# Patient Record
Sex: Male | Born: 1970 | Race: White | Hispanic: No | Marital: Married | Smoking: Never smoker
Health system: Southern US, Community
[De-identification: ages and names within clinical notes are randomized; demographics above are authoritative.]

## PROBLEM LIST (undated history)

## (undated) DIAGNOSIS — I10 Essential (primary) hypertension: Secondary | ICD-10-CM

## (undated) DIAGNOSIS — T7840XA Allergy, unspecified, initial encounter: Secondary | ICD-10-CM

## (undated) DIAGNOSIS — C801 Malignant (primary) neoplasm, unspecified: Secondary | ICD-10-CM

## (undated) HISTORY — DX: Allergy, unspecified, initial encounter: T78.40XA

## (undated) HISTORY — DX: Malignant (primary) neoplasm, unspecified: C80.1

## (undated) HISTORY — PX: TONSILLECTOMY: SUR1361

---

## 1999-10-11 HISTORY — PX: TESTICLE REMOVAL: SHX68

## 2015-03-20 ENCOUNTER — Encounter: Payer: Self-pay | Admitting: Family Medicine

## 2015-03-20 ENCOUNTER — Ambulatory Visit (INDEPENDENT_AMBULATORY_CARE_PROVIDER_SITE_OTHER): Payer: BLUE CROSS/BLUE SHIELD | Admitting: Family Medicine

## 2015-03-20 VITALS — BP 122/82 | HR 94 | Temp 98.5°F | Resp 16 | Ht 72.0 in | Wt 252.6 lb

## 2015-03-20 DIAGNOSIS — L259 Unspecified contact dermatitis, unspecified cause: Secondary | ICD-10-CM | POA: Diagnosis not present

## 2015-03-20 MED ORDER — PREDNISONE 20 MG PO TABS: ORAL_TABLET | ORAL | Status: DC

## 2015-03-20 MED ORDER — METHYLPREDNISOLONE ACETATE 80 MG/ML IJ SUSP
80.0000 mg | Freq: Once | INTRAMUSCULAR | Status: AC
  Administered 2015-03-20: 80 mg via INTRAMUSCULAR

## 2015-03-20 NOTE — Progress Notes (Signed)
Subjective:     Patient ID: Peter Ray, male   DOB: [birth year], 45 y.o.   MRN: [id number]  HPI  Chief Complaint  Patient presents with  . Rash    Patient states that two week go he was cutting grass in yard and used a weed eater in his yard, he believes that he might have gotten into poison ivy because rash formed shorltly after. Patient reports that over this two weeks he has had itching of the skin only in the lower extremity and now rash has formed into weeping blisters. Today patient noticed that rash is spreading to his left arm.   States he was wearing shorst and both walked in the brush then used the trimmer which aerosolized it. He cleansed with alcohol initially and has since been cleaning with soap and water.   Review of Systems  Constitutional: Negative for fever.       Objective:   Physical Exam  Constitutional: He appears well-developed and well-nourished. No distress.  Skin: Rash (both lower extremities with large patches of rash with vesicles and bullae) noted.       Assessment:     1. Contact dermatitis  - predniSONE (DELTASONE) 20 MG tablet; Taper as follows 3 pills x 4 days,2 pills x 4 days, one pill x 4 days  Dispense: 24 tablet; Refill: 0 - methylPREDNISolone acetate (DEPO-MEDROL) injection 80 mg; Inject 1 mL (80 mg total) into the muscle once.    Plan:     May also use oral antihistamines for relief from itching.

## 2015-03-20 NOTE — Patient Instructions (Signed)
Continue to clean with soap and water. May use antihistamines ORALLY like Benadryl and/or Claritin for itching.

## 2015-06-30 ENCOUNTER — Other Ambulatory Visit: Payer: Self-pay

## 2015-06-30 MED ORDER — CLONAZEPAM 0.5 MG PO TABS: ORAL_TABLET | ORAL | Status: DC

## 2015-06-30 NOTE — Telephone Encounter (Signed)
email from patient requesting this refilled. Please review order pulled down. LOV 12/2014-aa

## 2015-07-01 ENCOUNTER — Telehealth: Payer: Self-pay

## 2015-07-01 NOTE — Telephone Encounter (Signed)
Patient has been notified that Rx is available for pick up

## 2015-07-01 NOTE — Telephone Encounter (Signed)
-----   Message from Carmon Ginsberg, [place] sent at 06/30/2015  4:49 PM EDT ----- rx for clonazepam is available for p/u at our office.

## 2015-08-13 ENCOUNTER — Encounter: Payer: Self-pay | Admitting: Family Medicine

## 2015-08-13 ENCOUNTER — Ambulatory Visit (INDEPENDENT_AMBULATORY_CARE_PROVIDER_SITE_OTHER): Payer: BLUE CROSS/BLUE SHIELD | Admitting: Family Medicine

## 2015-08-13 ENCOUNTER — Ambulatory Visit
Admission: RE | Admit: 2015-08-13 | Discharge: 2015-08-13 | Disposition: A | Discharge status: Home / Self Care | Payer: BLUE CROSS/BLUE SHIELD | Source: Ambulatory Visit | Attending: Family Medicine | Admitting: Family Medicine

## 2015-08-13 VITALS — BP 124/82 | HR 90 | Temp 98.4°F | Resp 14 | Wt 250.0 lb

## 2015-08-13 DIAGNOSIS — R0602 Shortness of breath: Secondary | ICD-10-CM | POA: Diagnosis not present

## 2015-08-13 DIAGNOSIS — J309 Allergic rhinitis, unspecified: Secondary | ICD-10-CM | POA: Insufficient documentation

## 2015-08-13 DIAGNOSIS — F439 Reaction to severe stress, unspecified: Secondary | ICD-10-CM | POA: Insufficient documentation

## 2015-08-13 DIAGNOSIS — Z77120 Contact with and (suspected) exposure to mold (toxic): Secondary | ICD-10-CM | POA: Insufficient documentation

## 2015-08-13 DIAGNOSIS — R030 Elevated blood-pressure reading, without diagnosis of hypertension: Secondary | ICD-10-CM | POA: Insufficient documentation

## 2015-08-13 MED ORDER — PREDNISONE 5 MG (48) PO TBPK: 5.0000 mg | ORAL_TABLET | Freq: Every day | ORAL | Status: DC

## 2015-08-13 NOTE — Progress Notes (Signed)
Patient ID: Peter Ray, male   DOB: 1971/01/13, 44 y.o.   MRN: [id number]    Subjective:  HPI  Patient is here to discuss shortness of breath.  Patient states that he went to [place] about 4 weeks ago and ever since then has had difficulty breathing-describes sensation like he can not get enough air in his lungs. Not like panic attack, he does have stress in his life, some chest tightness at times, has been dizzy for about a week. He has been taking Klonopin 1/2 tablet daily and that has helped some. His B/P the order day was around 160/90. No headache. He does work around a lot of mold.  Prior to Admission medications   Medication Sig Start Date End Date Taking? Authorizing Provider  clonazePAM (KLONOPIN) 0.5 MG tablet 1/2 to one twice daily as needed for stress/anxiety 06/30/15  Yes Carmon Ginsberg, PA  loratadine (CLARITIN) 10 MG tablet Take by mouth.    Historical Provider, MD  predniSONE (DELTASONE) 20 MG tablet Taper as follows 3 pills x 4 days,2 pills x 4 days, one pill x 4 days 03/20/15   Carmon Ginsberg, PA    Patient Active Problem List   Diagnosis Date Noted  . Allergic rhinitis 08/13/2015  . Blood pressure elevated 08/13/2015  . Feeling stressed out 08/13/2015    Past Medical History  Diagnosis Date  . Cancer ([place])   . Allergy     Social History   Social History  . Marital Status: Married    Spouse Name: N/A  . Number of Children: N/A  . Years of Education: N/A   Occupational History  . Not on file.   Social History Main Topics  . Smoking status: Never Smoker   . Smokeless tobacco: Never Used  . Alcohol Use: 0.0 oz/week    0 Standard drinks or equivalent per week     Comment: occasional   . Drug Use: No  . Sexual Activity: Not on file   Other Topics Concern  . Not on file   Social History Narrative    Allergies  Allergen Reactions  . Iodinated Diagnostic Agents     contrast  . Levofloxacin   . Shellfish Allergy   . Tree Extract     and grass     Review of Systems  Constitutional: Negative.   HENT: Negative for congestion.   Respiratory: Positive for cough (at times) and shortness of breath.   Cardiovascular: Negative for chest pain, palpitations and leg swelling.  Gastrointestinal: Positive for heartburn. Negative for nausea, vomiting, abdominal pain and diarrhea.  Musculoskeletal: Negative.   Neurological: Positive for dizziness. Negative for headaches.  Psychiatric/Behavioral: The patient is nervous/anxious.      There is no immunization history on file for this patient. Objective:  BP 124/82 mmHg  Pulse 90  Temp(Src) 98.4 F (36.9 C)  Resp 14  Wt 250 lb (113.399 kg)  SpO2 97%  Physical Exam  Constitutional: He is oriented to person, place, and time and well-developed, well-nourished, and in no distress.  HENT:  Head: Normocephalic.  Eyes: Conjunctivae are normal. Pupils are equal, round, and reactive to light.  Neck: Normal range of motion. Neck supple.  Cardiovascular: Normal rate, regular rhythm, normal heart sounds and intact distal pulses.  Exam reveals no friction rub.   No murmur heard. Pulmonary/Chest: Effort normal and breath sounds normal. No respiratory distress. He has no wheezes.  Musculoskeletal: Normal range of motion. He exhibits no edema or tenderness.  Neurological: He is  alert and oriented to person, place, and time.  Psychiatric: Mood, memory, affect and judgment normal.      Assessment and Plan :  1. Shortness of breath - EKG 12-Lead - Spirometry with graph - DG Chest 2 View; Future  2. Exposure to mold - Spirometry with graph - DG Chest 2 View; Future  3. Reactive airway disease  Treatment with prednisone 5 mg 12 day taper. May need pulmonary referral or allergy referral.  4. Adjustment reaction  Patient is family about a lot of stress recently. Wife is at major health issues. Patient is considering changing jobs again later this month. Miguel Aschoff MD [facility] 08/13/2015 3:39 PM

## 2015-08-31 ENCOUNTER — Ambulatory Visit (INDEPENDENT_AMBULATORY_CARE_PROVIDER_SITE_OTHER): Payer: BLUE CROSS/BLUE SHIELD | Admitting: Family Medicine

## 2015-08-31 ENCOUNTER — Encounter: Payer: Self-pay | Admitting: Family Medicine

## 2015-08-31 VITALS — BP 118/72 | HR 94 | Temp 98.2°F | Resp 12 | Wt 252.0 lb

## 2015-08-31 DIAGNOSIS — F419 Anxiety disorder, unspecified: Secondary | ICD-10-CM

## 2015-08-31 DIAGNOSIS — R5383 Other fatigue: Secondary | ICD-10-CM | POA: Diagnosis not present

## 2015-08-31 MED ORDER — SERTRALINE HCL 50 MG PO TABS: 50.0000 mg | ORAL_TABLET | Freq: Every day | ORAL | Status: DC

## 2015-08-31 NOTE — Progress Notes (Signed)
Patient ID: Peter Ray, male   DOB: Aug 02, 1971, 44 y.o.   MRN: [id number]    Subjective:  HPI  Shortness of breath follow up: Patient was last seen on November 3rd. EKG, Spirometry, Chest Xray was done.  Diagnoses was given reactive airway disease-treated with Prednisone. Patient did not take his medication the right away he did not get instructions on how to take prednisone so he took 1 tablet daily for 6 days. Symptoms come and go and not much of a improvement at this time.  Anxiety: stress in family was an issue last time. No changes were made. He has had to take 1 tablet twice daily instead of 1 tablet. He wants to know how long this will last. He has never taking anything else for this. Pt feels better.We discussed chronic anxiety at length. Triggers for symptoms that make is get worse or acting up are like "work related stuff, death." Depression screen [facility] 12/06/2022 08/13/2015  Decreased Interest 0  Down, Depressed, Hopeless 0  PHQ - 2 Score 0  Altered sleeping 0  Tired, decreased energy 1  Change in appetite 1  Feeling bad or failure about yourself  0  Trouble concentrating 0  Moving slowly or fidgety/restless 0  Suicidal thoughts 0  PHQ-9 Score 2     Prior to Admission medications   Medication Sig Start Date End Date Taking? Authorizing Provider  clonazePAM (KLONOPIN) 0.5 MG tablet 1/2 to one twice daily as needed for stress/anxiety 06/30/15   Carmon Ginsberg, PA  loratadine (CLARITIN) 10 MG tablet Take by mouth.    Historical Provider, MD  predniSONE (STERAPRED UNI-PAK 48 TAB) 5 MG (48) TBPK tablet Take 1 tablet (5 mg total) by mouth daily. 08/13/15   Richard Maceo Pro., MD    Patient Active Problem List   Diagnosis Date Noted  . Allergic rhinitis 08/13/2015  . Blood pressure elevated 08/13/2015  . Feeling stressed out 08/13/2015    Past Medical History  Diagnosis Date  . Cancer ([place])   . Allergy     Social History   Social History  . Marital Status: Married      Spouse Name: N/A  . Number of Children: N/A  . Years of Education: N/A   Occupational History  . Not on file.   Social History Main Topics  . Smoking status: Never Smoker   . Smokeless tobacco: Never Used  . Alcohol Use: 0.0 oz/week    0 Standard drinks or equivalent per week     Comment: occasional   . Drug Use: No  . Sexual Activity: Not on file   Other Topics Concern  . Not on file   Social History Narrative    Allergies  Allergen Reactions  . Iodinated Diagnostic Agents     contrast  . Levofloxacin   . Shellfish Allergy   . Tree Extract     and grass    Review of Systems  Constitutional: Negative.   Respiratory: Positive for shortness of breath.   Cardiovascular: Negative.   Gastrointestinal: Negative.   Musculoskeletal: Negative.   Psychiatric/Behavioral: The patient is nervous/anxious.      Objective:  BP 118/72 mmHg  Pulse 94  Temp(Src) 98.2 F (36.8 C)  Resp 12  Wt 252 lb (114.306 kg)  Physical Exam  Constitutional: He is oriented to person, place, and time and well-developed, well-nourished, and in no distress.  HENT:  Head: Normocephalic and atraumatic.  Right Ear: External ear normal.  Left Ear: External ear  normal.  Nose: Nose normal.  Eyes: Conjunctivae are normal.  Neck: Neck supple.  Cardiovascular: Normal rate, regular rhythm and normal heart sounds.   Pulmonary/Chest: Effort normal and breath sounds normal.  Abdominal: Soft.  Neurological: He is alert and oriented to person, place, and time.  Skin: Skin is warm and dry.  Psychiatric: Mood, memory, affect and judgment normal.     Assessment and Plan :  1. Acute anxiety/chronic anxiety More than 50% of time spent in in counselling. - sertraline (ZOLOFT) 50 MG tablet; Take 1 tablet (50 mg total) by mouth daily.  Dispense: 30 tablet; Refill: 3  2. Other fatigue  - CBC with Differential/Platelet - Comprehensive metabolic panel - TSH   Miguel Aschoff MD [facility] 08/31/2015 3:36 PM

## 2015-09-01 ENCOUNTER — Telehealth: Payer: Self-pay

## 2015-09-01 LAB — CBC WITH DIFFERENTIAL/PLATELET
BASOS ABS: 0 10*3/uL (ref 0.0–0.2)
Basos: 0 %
EOS (ABSOLUTE): 0.3 10*3/uL (ref 0.0–0.4)
EOS: 4 %
HEMATOCRIT: 44.4 % (ref 37.5–51.0)
HEMOGLOBIN: 15.7 g/dL (ref 12.6–17.7)
IMMATURE GRANS (ABS): 0 10*3/uL (ref 0.0–0.1)
Immature Granulocytes: 0 %
LYMPHS ABS: 2.6 10*3/uL (ref 0.7–3.1)
LYMPHS: 32 %
MCH: 31 pg (ref 26.6–33.0)
MCHC: 35.4 g/dL (ref 31.5–35.7)
MCV: 88 fL (ref 79–97)
MONOCYTES: 6 %
Monocytes Absolute: 0.5 10*3/uL (ref 0.1–0.9)
NEUTROS ABS: 4.7 10*3/uL (ref 1.4–7.0)
Neutrophils: 58 %
Platelets: 227 10*3/uL (ref 150–379)
RBC: 5.06 x10E6/uL (ref 4.14–5.80)
RDW: 13.1 % (ref 12.3–15.4)
WBC: 8.1 10*3/uL (ref 3.4–10.8)

## 2015-09-01 LAB — TSH: TSH: 1.93 u[IU]/mL (ref 0.450–4.500)

## 2015-09-01 LAB — COMPREHENSIVE METABOLIC PANEL
ALBUMIN: 4.7 g/dL (ref 3.5–5.5)
ALK PHOS: 59 IU/L (ref 39–117)
ALT: 20 IU/L (ref 0–44)
AST: 25 IU/L (ref 0–40)
Albumin/Globulin Ratio: 1.9 (ref 1.1–2.5)
BILIRUBIN TOTAL: 0.3 mg/dL (ref 0.0–1.2)
BUN / CREAT RATIO: 19 (ref 9–20)
BUN: 18 mg/dL (ref 6–24)
CO2: 26 mmol/L (ref 18–29)
CREATININE: 0.94 mg/dL (ref 0.76–1.27)
Calcium: 9.4 mg/dL (ref 8.7–10.2)
Chloride: 100 mmol/L (ref 97–106)
GFR calc non Af Amer: 98 mL/min/{1.73_m2} (ref >59)
GFR, EST AFRICAN AMERICAN: 114 mL/min/{1.73_m2} (ref >59)
GLOBULIN, TOTAL: 2.5 g/dL (ref 1.5–4.5)
GLUCOSE: 82 mg/dL (ref 65–99)
Potassium: 4.6 mmol/L (ref 3.5–5.2)
SODIUM: 141 mmol/L (ref 136–144)
TOTAL PROTEIN: 7.2 g/dL (ref 6.0–8.5)

## 2015-09-01 NOTE — Telephone Encounter (Signed)
Pt informed and voiced understanding of results. 

## 2015-09-01 NOTE — Telephone Encounter (Signed)
-----   Message from Jerrol Banana., MD sent at 09/01/2015  8:13 AM EST ----- Labs normal.

## 2015-09-01 NOTE — Telephone Encounter (Signed)
LMTCB ED 

## 2015-09-04 ENCOUNTER — Telehealth: Payer: Self-pay

## 2015-09-04 NOTE — Telephone Encounter (Signed)
Patient called and states that he had to stop Sertraline. This was started last week. He developed rash and blisters on his mouth, he stopped taking the medication and has been taking Benadryl. He does not have any trouble breathing or swallowing. Patient wanted to let us know and what medication he should try now-aa

## 2015-09-07 NOTE — Telephone Encounter (Signed)
Would try totally differnet type med--Bupropion XL150mg  dail.

## 2015-09-07 NOTE — Telephone Encounter (Signed)
Left message to call back  

## 2015-09-09 MED ORDER — BUPROPION HCL ER (XL) 150 MG PO TB24: 150.0000 mg | ORAL_TABLET | Freq: Every day | ORAL | Status: DC

## 2015-09-09 NOTE — Telephone Encounter (Signed)
Pt advised adm RX sent in-aa

## 2015-09-14 ENCOUNTER — Telehealth: Payer: Self-pay | Admitting: Family Medicine

## 2015-09-14 NOTE — Telephone Encounter (Signed)
Pt called saying the last medication you gave him (he did not know the name, it was for anxiety)  Has caused a reaction,  Rash, lips a little swollen .  He said for right now he did not want to take anything for anxiety.  Pt's Call back [phone]  Thanks [name]

## 2015-09-14 NOTE — Telephone Encounter (Signed)
I d/c Bupropion and put it in his allergies.   THIS FYI:=aa

## 2015-09-30 ENCOUNTER — Ambulatory Visit (INDEPENDENT_AMBULATORY_CARE_PROVIDER_SITE_OTHER): Payer: BLUE CROSS/BLUE SHIELD | Admitting: Family Medicine

## 2015-09-30 VITALS — BP 138/82 | HR 78 | Temp 97.8°F | Resp 16 | Wt 247.0 lb

## 2015-09-30 DIAGNOSIS — Z658 Other specified problems related to psychosocial circumstances: Secondary | ICD-10-CM

## 2015-09-30 DIAGNOSIS — F439 Reaction to severe stress, unspecified: Secondary | ICD-10-CM

## 2015-09-30 NOTE — Progress Notes (Signed)
Patient ID: Peter Ray, male   DOB: Mar 08, 1971, 44 y.o.   MRN: [id number]    Subjective:  HPI Pt is here for what was suppose to be a 1 month follow up from anxiety. He was initially started on Sertraline, he called back that it gave him hives then he was switched to Bupropion, that made his lips swell. Pt wants to be off these medications and does not want to try anymore at this point. He reports that he is starting a new job and he wants to see if that will help his anxiety.   Prior to Admission medications   Medication Sig Start Date End Date Taking? Authorizing Provider  clonazePAM (KLONOPIN) 0.5 MG tablet 1/2 to one twice daily as needed for stress/anxiety 06/30/15  Yes Carmon Ginsberg, PA  loratadine (CLARITIN) 10 MG tablet Take by mouth.   Yes Historical Provider, MD    Patient Active Problem List   Diagnosis Date Noted  . Allergic rhinitis 08/13/2015  . Blood pressure elevated 08/13/2015  . Feeling stressed out 08/13/2015    Past Medical History  Diagnosis Date  . Cancer ([place])   . Allergy     Social History   Social History  . Marital Status: Married    Spouse Name: N/A  . Number of Children: N/A  . Years of Education: N/A   Occupational History  . Not on file.   Social History Main Topics  . Smoking status: Never Smoker   . Smokeless tobacco: Never Used  . Alcohol Use: 0.0 oz/week    0 Standard drinks or equivalent per week     Comment: occasional   . Drug Use: No  . Sexual Activity: Not on file   Other Topics Concern  . Not on file   Social History Narrative    Allergies  Allergen Reactions  . Iodinated Diagnostic Agents     contrast  . Levofloxacin   . Sertraline Hives  . Shellfish Allergy   . Tree Extract     and grass  . Bupropion Rash    Also lips were a little swollen    Review of Systems  Constitutional: Negative.   HENT: Negative.   Eyes: Negative.   Respiratory: Negative.   Cardiovascular: Negative.   Gastrointestinal: Negative.    Genitourinary: Negative.   Musculoskeletal: Negative.   Skin: Negative.   Neurological: Negative.   Endo/Heme/Allergies: Negative.   Psychiatric/Behavioral: The patient is nervous/anxious.      There is no immunization history on file for this patient. Objective:  BP 138/82 mmHg  Pulse 78  Temp(Src) 97.8 F (36.6 C) (Oral)  Resp 16  Wt 247 lb (112.038 kg)  Physical Exam  Constitutional: He is oriented to person, place, and time and well-developed, well-nourished, and in no distress.  Eyes: Conjunctivae and EOM are normal. Pupils are equal, round, and reactive to light.  Neck: Normal range of motion. Neck supple.  Cardiovascular: Normal rate, regular rhythm, normal heart sounds and intact distal pulses.   Pulmonary/Chest: Effort normal and breath sounds normal.  Musculoskeletal: Normal range of motion.  Neurological: He is alert and oriented to person, place, and time. He has normal reflexes. Gait normal. GCS score is 15.  Skin: Skin is warm and dry.  Psychiatric: Mood, memory, affect and judgment normal.    Lab Results  Component Value Date   WBC 8.1 08/31/2015   HCT 44.4 08/31/2015   GLUCOSE 82 08/31/2015   TSH 1.930 08/31/2015    CMP  Component Value Date/Time   NA 141 08/31/2015 1620   K 4.6 08/31/2015 1620   CL 100 08/31/2015 1620   CO2 26 08/31/2015 1620   GLUCOSE 82 08/31/2015 1620   BUN 18 08/31/2015 1620   CREATININE 0.94 08/31/2015 1620   CALCIUM 9.4 08/31/2015 1620   PROT 7.2 08/31/2015 1620   ALBUMIN 4.7 08/31/2015 1620   AST 25 08/31/2015 1620   ALT 20 08/31/2015 1620   ALKPHOS 59 08/31/2015 1620   BILITOT 0.3 08/31/2015 1620   GFRNONAA 98 08/31/2015 1620   GFRAA 114 08/31/2015 1620    Assessment and Plan :  1. Feeling stressed out/chronic anxiety Pt wants to stay off any medications for right now. He is starting a new job and thinks that will help. Will call if he needs anything and will take Klonopin if needed. Hopefully new job will  help with this issue. CPE summer 2017. Patient was seen and examined by Dr. Miguel Aschoff, and noted scribed by Webb Laws, [place] MD [facility] 09/30/2015 4:23 PM

## 2016-01-11 ENCOUNTER — Ambulatory Visit (INDEPENDENT_AMBULATORY_CARE_PROVIDER_SITE_OTHER): Payer: Managed Care, Other (non HMO) | Admitting: Family Medicine

## 2016-01-11 ENCOUNTER — Encounter: Payer: Self-pay | Admitting: Family Medicine

## 2016-01-11 VITALS — BP 118/78 | HR 88 | Temp 98.6°F | Resp 14 | Wt 239.0 lb

## 2016-01-11 DIAGNOSIS — J309 Allergic rhinitis, unspecified: Secondary | ICD-10-CM | POA: Diagnosis not present

## 2016-01-11 DIAGNOSIS — R0982 Postnasal drip: Secondary | ICD-10-CM

## 2016-01-11 NOTE — Progress Notes (Signed)
Patient ID: Peter Ray, male   DOB: 1971-01-16, 45 y.o.   MRN: [id number]       Patient: Peter Ray Male    DOB: 1971/02/05   45 y.o.   MRN: [id number] Visit Date: 01/11/2016  Today's Provider: Vernie Murders, PA   Chief Complaint  Patient presents with  . Dysphagia    post nasal drainage  . Lymphadenopathy    neck   Subjective:    HPI Pt is here today for post nasal drainage that is causing him to have dysphagia. He reports that he has not problem swallowing foods, just cold water. He reports that he feels like his lymph nodes in his neck are swollen. He thinks all this could be related to allergies but has never had them to this extent in the past. He has taken benadryl and it seems to work but makes him sleepy, so he can not take it during the day time. He has been taking Claritin also, it helps slightly. He reports that this has been going on for about 2 weeks. Denies significant dyspepsia.without reflux at night.   Patient Active Problem List   Diagnosis Date Noted  . Allergic rhinitis 08/13/2015  . Blood pressure elevated 08/13/2015  . Feeling stressed out 08/13/2015   Past Surgical History  Procedure Laterality Date  . Testicle removal Right 2001    secondary to cancer  . Tonsillectomy     History reviewed. No pertinent family history.  Allergies  Allergen Reactions  . Iodinated Diagnostic Agents     contrast  . Levofloxacin   . Sertraline Hives  . Shellfish Allergy   . Tree Extract     and grass  . Bupropion Rash    Also lips were a little swollen   Previous Medications   CLONAZEPAM (KLONOPIN) 0.5 MG TABLET    1/2 to one twice daily as needed for stress/anxiety   LORATADINE (CLARITIN) 10 MG TABLET    Take by mouth.    Review of Systems  Constitutional: Negative.   HENT: Positive for postnasal drip and sore throat.   Eyes: Negative.   Respiratory: Choking: trouble swollowing cold water.   Cardiovascular: Negative.   Gastrointestinal: Negative.     Endocrine: Negative.   Genitourinary: Negative.   Musculoskeletal: Negative.   Skin: Negative.   Allergic/Immunologic: Negative.   Neurological: Negative.   Hematological: Negative.   Psychiatric/Behavioral: Negative.     Social History  Substance Use Topics  . Smoking status: Never Smoker   . Smokeless tobacco: Never Used  . Alcohol Use: 0.0 oz/week    0 Standard drinks or equivalent per week     Comment: occasional    Objective:   BP 118/78 mmHg  Pulse 88  Temp(Src) 98.6 F (37 C) (Oral)  Resp 14  Wt 239 lb (108.41 kg)  SpO2 98%  Physical Exam  Constitutional: He is oriented to person, place, and time. He appears well-developed and well-nourished. No distress.  HENT:  Head: Normocephalic and atraumatic.  Right Ear: Hearing and external ear normal.  Left Ear: Hearing and external ear normal.  Nose: Nose normal.  Slightly irritated cobblestone-appearing posterior pharynx.  Eyes: Conjunctivae and lids are normal. Right eye exhibits no discharge. Left eye exhibits no discharge. No scleral icterus.  Neck: Neck supple.  Cardiovascular: Normal rate and regular rhythm.   Pulmonary/Chest: Effort normal and breath sounds normal. No respiratory distress.  Abdominal: Soft. Bowel sounds are normal.  Musculoskeletal: Normal range of motion.  Lymphadenopathy:  He has no cervical adenopathy.  Neurological: He is alert and oriented to person, place, and time.  Skin: Skin is intact. No lesion and no rash noted.  Psychiatric: He has a normal mood and affect. His speech is normal and behavior is normal. Thought content normal.      Assessment & Plan:     1. Postnasal drip Onset over the past couple weeks. Recommend using Allegra BID and nasal steroid. May gargle with hot saltwater and recheck prn.  2. Allergic rhinitis, unspecified allergic rhinitis type Usually has some issues with springtime pollen reaction. Will use above medications and recheck prn if further problems with  swallowing.       Vernie Murders, PA  [facility]

## 2016-06-24 ENCOUNTER — Ambulatory Visit (INDEPENDENT_AMBULATORY_CARE_PROVIDER_SITE_OTHER): Payer: Managed Care, Other (non HMO) | Admitting: Family Medicine

## 2016-06-24 ENCOUNTER — Encounter: Payer: Self-pay | Admitting: Family Medicine

## 2016-06-24 VITALS — BP 142/94 | HR 88 | Temp 98.8°F | Resp 16 | Wt 243.2 lb

## 2016-06-24 DIAGNOSIS — R109 Unspecified abdominal pain: Secondary | ICD-10-CM

## 2016-06-24 DIAGNOSIS — Z8547 Personal history of malignant neoplasm of testis: Secondary | ICD-10-CM

## 2016-06-24 LAB — POCT URINALYSIS DIPSTICK
BILIRUBIN UA: NEGATIVE
Blood, UA: NEGATIVE
Glucose, UA: NEGATIVE
KETONES UA: NEGATIVE
LEUKOCYTES UA: NEGATIVE
Nitrite, UA: NEGATIVE
PROTEIN UA: NEGATIVE
Spec Grav, UA: <=1.005
Urobilinogen, UA: 0.2
pH, UA: 6.5

## 2016-06-24 MED ORDER — DOXYCYCLINE HYCLATE 100 MG PO TABS: 100.0000 mg | ORAL_TABLET | Freq: Two times a day (BID) | ORAL | 0 refills | Status: DC

## 2016-06-24 NOTE — Progress Notes (Signed)
Patient: Peter Ray Male    DOB: [birth year]   45 y.o.   MRN: [id number] Visit Date: 06/24/2016  Today's Provider: Vernie Murders, PA   Chief Complaint  Patient presents with  . Abdominal Pain   Subjective:    Abdominal Pain  This is a new problem. The current episode started 1 to 4 weeks ago. The onset quality is sudden. The problem occurs constantly. The problem has been gradually improving. The pain is located in the LLQ and LUQ. The quality of the pain is aching. Pain radiation: left groin area into left testicle  The pain is aggravated by movement. Treatments tried: cranberry juice and advil. The treatment provided moderate relief.  Describes pain as an ache in back and left testicle. No pain with physical activities. No dysuria. Discomfort seems to be abating.  Past Medical History:  Diagnosis Date  . Allergy   . Cancer [facility])    Past Surgical History:  Procedure Laterality Date  . TESTICLE REMOVAL Right 2001   secondary to cancer  . TONSILLECTOMY     No family history on file.   Allergies  Allergen Reactions  . Iodinated Diagnostic Agents     contrast  . Levofloxacin   . Sertraline Hives  . Shellfish Allergy   . Tree Extract     and grass  . Bupropion Rash    Also lips were a little swollen      Previous Medications   CLONAZEPAM (KLONOPIN) 0.5 MG TABLET    1/2 to one twice daily as needed for stress/anxiety   FEXOFENADINE-PSEUDOEPHEDRINE (ALLEGRA-D) 60-120 MG 12 HR TABLET    Take 1 tablet by mouth 2 (two) times daily.   FLUTICASONE (FLONASE) 50 MCG/ACT NASAL SPRAY    Place into both nostrils daily.    Review of Systems  Constitutional: Negative.   Respiratory: Negative.   Cardiovascular: Negative.   Gastrointestinal: Positive for abdominal pain.    Social History  Substance Use Topics  . Smoking status: Never Smoker  . Smokeless tobacco: Never Used  . Alcohol use 0.0 oz/week     Comment: occasional    Objective:   BP (!) 142/94 (BP Location:  Right Arm, Patient Position: Sitting, Cuff Size: Large)   Pulse 88   Temp 98.8 F (37.1 C) (Oral)   Resp 16   Wt 243 lb 3.2 oz (110.3 kg)   SpO2 97%   BMI 32.98 kg/m   Physical Exam  Constitutional: He is oriented to person, place, and time. He appears well-developed and well-nourished. No distress.  HENT:  Head: Normocephalic and atraumatic.  Right Ear: Hearing normal.  Left Ear: Hearing normal.  Nose: Nose normal.  Eyes: Conjunctivae and lids are normal. Right eye exhibits no discharge. Left eye exhibits no discharge. No scleral icterus.  Neck: Neck supple.  Cardiovascular: Normal rate and regular rhythm.   Pulmonary/Chest: Effort normal and breath sounds normal. No respiratory distress.  Abdominal: Soft. Bowel sounds are normal. There is no tenderness.  Genitourinary: Penis normal.  Genitourinary Comments: Right testicle surgically absent. No masses or tenderness in left testicle. No scrotal masses or local lymphadenopathy. No abdominal pain or CVA tenderness to percussion posteriorly.  Musculoskeletal: Normal range of motion.  Neurological: He is alert and oriented to person, place, and time.  Skin: Skin is intact. No lesion and no rash noted.  Psychiatric: He has a normal mood and affect. His speech is normal and behavior is normal. Thought content normal.      Assessment &  Plan:     1. Left flank pain Onset over the past week with left flank pain radiating into the left testicle region. No hematuria, dysuria or fever. Urinalysis and microscopic exam essentially normal. May have flushed out a kidney stone with increase in fluid intake. Given antibiotic to use if testicular discomfort returns - possible epididymitis. Recheck prn. - POCT Urinalysis Dipstick - doxycycline (VIBRA-TABS) 100 MG tablet; Take 1 tablet (100 mg total) by mouth 2 (two) times daily.  Dispense: 20 tablet; Refill: 0  2. 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 No  sign of recurrence.

## 2016-07-01 ENCOUNTER — Ambulatory Visit (INDEPENDENT_AMBULATORY_CARE_PROVIDER_SITE_OTHER): Payer: Managed Care, Other (non HMO) | Admitting: Family Medicine

## 2016-07-01 ENCOUNTER — Encounter: Payer: Self-pay | Admitting: Family Medicine

## 2016-07-01 VITALS — BP 132/80 | HR 64 | Temp 98.3°F | Resp 14 | Wt 245.6 lb

## 2016-07-01 DIAGNOSIS — R109 Unspecified abdominal pain: Secondary | ICD-10-CM

## 2016-07-01 LAB — POCT URINALYSIS DIPSTICK
BILIRUBIN UA: NEGATIVE
Blood, UA: NEGATIVE
GLUCOSE UA: NEGATIVE
KETONES UA: NEGATIVE
LEUKOCYTES UA: NEGATIVE
Nitrite, UA: NEGATIVE
PH UA: 6
Protein, UA: NEGATIVE
Spec Grav, UA: 1.01
Urobilinogen, UA: 0.2

## 2016-07-01 NOTE — Progress Notes (Signed)
Patient: Peter Ray Male    DOB: 10/15/1970   45 y.o.   MRN: [id number] Visit Date: 07/01/2016  Today's Provider: Vernie Murders, PA   Chief Complaint  Patient presents with  . Flank Pain  . Follow-up   Subjective:    HPI Patient is here to follow up from office visit on 06/24/2016. Patient was prescribed Doxycycline for left flank pain. Patient reports the pain has improved some but is still there. Patient states pain is worse when lifting.   Past Medical History:  Diagnosis Date  . Allergy   . Cancer [facility])    Past Surgical History:  Procedure Laterality Date  . TESTICLE REMOVAL Right 2001   secondary to cancer  . TONSILLECTOMY     Allergies  Allergen Reactions  . Iodinated Diagnostic Agents     contrast  . Levofloxacin   . Sertraline Hives  . Shellfish Allergy   . Tree Extract     and grass  . Bupropion Rash    Also lips were a little swollen     Previous Medications   CLONAZEPAM (KLONOPIN) 0.5 MG TABLET    1/2 to one twice daily as needed for stress/anxiety   DOXYCYCLINE (VIBRA-TABS) 100 MG TABLET    Take 1 tablet (100 mg total) by mouth 2 (two) times daily.   FEXOFENADINE-PSEUDOEPHEDRINE (ALLEGRA-D) 60-120 MG 12 HR TABLET    Take 1 tablet by mouth 2 (two) times daily.   FLUTICASONE (FLONASE) 50 MCG/ACT NASAL SPRAY    Place into both nostrils daily.    Review of Systems  Constitutional: Negative.   Respiratory: Negative.   Cardiovascular: Negative.   Gastrointestinal: Positive for abdominal pain.    Social History  Substance Use Topics  . Smoking status: Never Smoker  . Smokeless tobacco: Never Used  . Alcohol use 0.0 oz/week     Comment: occasional    Objective:   BP 132/80 (BP Location: Right Arm, Patient Position: Sitting, Cuff Size: Large)   Pulse 64   Temp 98.3 F (36.8 C) (Oral)   Resp 14   Wt 245 lb 9.6 oz (111.4 kg)   BMI 33.31 kg/m   Physical Exam  Constitutional: He is oriented to person, place, and time. He appears well-developed  and well-nourished. No distress.  HENT:  Head: Normocephalic and atraumatic.  Right Ear: Hearing normal.  Left Ear: Hearing normal.  Nose: Nose normal.  Eyes: Conjunctivae and lids are normal. Right eye exhibits no discharge. Left eye exhibits no discharge. No scleral icterus.  Neck: Neck supple.  Cardiovascular: Regular rhythm.   Pulmonary/Chest: Effort normal and breath sounds normal. No respiratory distress.  Abdominal: Soft. Bowel sounds are normal.  No CVA tenderness to percussion. Questionable tender spot left upper abdomen. BS wnl. No masses, rebound or rigidity.  Genitourinary: Penis normal.  Genitourinary Comments: History of right orchidectomy. No left testicle tenderness today. No signs of hernia.    Musculoskeletal: Normal range of motion.  Neurological: He is alert and oriented to person, place, and time.  Skin: Skin is intact. No lesion and no rash noted.  Psychiatric: He has a normal mood and affect. His speech is normal and behavior is normal. Thought content normal.      Assessment & Plan:     1. Abdominal discomfort in left flank Improving discomfort. No further discomfort into the testicle. No hematuria or dysuria. No hernia palpable. Suspect possible renal stone with spontaneous passage or left epididymitis. Continue antibiotics and hot soaks. Recommend increased fluid  intake and bland diet. Recheck if no further improvement in 4 days. May need CT scan or ultrasound to rule out stone or pancreatitis. - POCT urinalysis dipstick

## 2016-07-18 ENCOUNTER — Encounter: Payer: Self-pay | Admitting: Family Medicine

## 2016-07-18 ENCOUNTER — Ambulatory Visit (INDEPENDENT_AMBULATORY_CARE_PROVIDER_SITE_OTHER): Payer: Managed Care, Other (non HMO) | Admitting: Family Medicine

## 2016-07-18 VITALS — BP 108/76 | HR 108 | Temp 98.4°F | Resp 16 | Wt 243.0 lb

## 2016-07-18 DIAGNOSIS — J302 Other seasonal allergic rhinitis: Secondary | ICD-10-CM | POA: Diagnosis not present

## 2016-07-18 DIAGNOSIS — R109 Unspecified abdominal pain: Secondary | ICD-10-CM

## 2016-07-18 DIAGNOSIS — Z8547 Personal history of malignant neoplasm of testis: Secondary | ICD-10-CM | POA: Diagnosis not present

## 2016-07-18 DIAGNOSIS — Z6832 Body mass index (BMI) 32.0-32.9, adult: Secondary | ICD-10-CM | POA: Diagnosis not present

## 2016-07-18 NOTE — Progress Notes (Signed)
Peter Ray  MRN: [id number] DOB: [birth year]  Subjective:  HPI  Patient is here to discuss left side pain-LLQ and radiates to the left lower back area. Sensation described as a achy sensation. He saw Simona Huh on 06/24/16 and UA was checked and it was normal, treated with Doxy and pain/discomfort did improve with the medication. He also has had some left testicle soreness off and on but has improved. He followed up with Simona Huh on 07/01/16 and UA was rechecked and it was normal. He has not had any imaging done for this issue. He has never had abdominal surgeries done. He has history of testicle cancer. Discomfort is worse with sitting down vs standing. Denies any urinary issues, no fevers, no vomiting. Patient is worried because of the persistence of the pain. Add to this is the fact that he has had testicular cancer and his wife has had breast cancer.Virl Axe not paranoid but they are aware that even young people can have cancer and dangerous health issues.  Patient Active Problem List   Diagnosis Date Noted  . Allergic rhinitis 08/13/2015  . Blood pressure elevated 08/13/2015  . Feeling stressed out 08/13/2015    Past Medical History:  Diagnosis Date  . Allergy   . Cancer [facility])     Social History   Social History  . Marital status: Married    Spouse name: N/A  . Number of children: N/A  . Years of education: N/A   Occupational History  . Not on file.   Social History Main Topics  . Smoking status: Never Smoker  . Smokeless tobacco: Never Used  . Alcohol use 0.0 oz/week     Comment: occasional   . Drug use: No  . Sexual activity: Not on file   Other Topics Concern  . Not on file   Social History Narrative  . No narrative on file    Outpatient Encounter Prescriptions as of 07/18/2016  Medication Sig  . fexofenadine-pseudoephedrine (ALLEGRA-D) 60-120 MG 12 hr tablet Take 1 tablet by mouth 2 (two) times daily.  . fluticasone (FLONASE) 50 MCG/ACT nasal spray Place into  both nostrils daily.  . clonazePAM (KLONOPIN) 0.5 MG tablet 1/2 to one twice daily as needed for stress/anxiety (Patient not taking: Reported on 07/18/2016)  . [DISCONTINUED] doxycycline (VIBRA-TABS) 100 MG tablet Take 1 tablet (100 mg total) by mouth 2 (two) times daily.   No facility-administered encounter medications on file as of 07/18/2016.     Allergies  Allergen Reactions  . Iodinated Diagnostic Agents     contrast  . Levofloxacin   . Sertraline Hives  . Shellfish Allergy   . Tree Extract     and grass  . Bupropion Rash    Also lips were a little swollen    Review of Systems  Constitutional: Negative.   HENT: Negative.   Eyes: Negative.   Respiratory: Negative.   Cardiovascular: Negative.   Gastrointestinal: Positive for abdominal pain.  Genitourinary:       Testicular soreness off and on  Musculoskeletal: Positive for myalgias.       Left knee clicks with straight leg.  Skin: Negative.   Neurological: Negative.   Endo/Heme/Allergies: Negative.   Psychiatric/Behavioral: Negative.    Objective:  BP 108/76   Pulse (!) 108   Temp 98.4 F (36.9 C)   Resp 16   Wt 243 lb (110.2 kg)   BMI 32.96 kg/m   Physical Exam  Constitutional: He is oriented to person, place, and time  and well-developed, well-nourished, and in no distress.  HENT:  Head: Normocephalic and atraumatic.  Cardiovascular: Normal rate, regular rhythm, normal heart sounds and intact distal pulses.   No murmur heard. Pulmonary/Chest: Effort normal and breath sounds normal. No respiratory distress. He has no wheezes.  Abdominal: Soft. He exhibits no distension and no mass. There is no tenderness. There is no guarding.  Musculoskeletal: He exhibits no tenderness.  Pulses are good and no adenopathy.  Neurological: He is alert and oriented to person, place, and time. No cranial nerve deficit. Coordination normal.  Negative figure 4.  Skin: Skin is warm and dry.  No rash.  Psychiatric: Mood, memory,  affect and judgment normal.    Assessment and Plan :  1. Abdominal discomfort in left flank Exam is normal today. Will order [place], labs-pending results. If [place] is normal will get CT scan at that time. I do not think he is having orthopedic issue, possibly muscular. Question diverticular disease.   2. History of testicular cancer Right side--s/p orchiectomy  HPI, Exam and A&P transcribed under direction and in the presence of Miguel Aschoff, MD.

## 2016-07-19 ENCOUNTER — Telehealth: Payer: Self-pay

## 2016-07-19 LAB — COMPREHENSIVE METABOLIC PANEL
A/G RATIO: 2 (ref 1.2–2.2)
ALBUMIN: 4.9 g/dL (ref 3.5–5.5)
ALT: 20 IU/L (ref 0–44)
AST: 19 IU/L (ref 0–40)
Alkaline Phosphatase: 57 IU/L (ref 39–117)
BILIRUBIN TOTAL: 0.4 mg/dL (ref 0.0–1.2)
BUN / CREAT RATIO: 17 (ref 9–20)
BUN: 19 mg/dL (ref 6–24)
CALCIUM: 9.6 mg/dL (ref 8.7–10.2)
CHLORIDE: 100 mmol/L (ref 96–106)
CO2: 25 mmol/L (ref 18–29)
Creatinine, Ser: 1.09 mg/dL (ref 0.76–1.27)
GFR, EST AFRICAN AMERICAN: 95 mL/min/{1.73_m2} (ref >59)
GFR, EST NON AFRICAN AMERICAN: 82 mL/min/{1.73_m2} (ref >59)
Globulin, Total: 2.5 g/dL (ref 1.5–4.5)
Glucose: 94 mg/dL (ref 65–99)
POTASSIUM: 4.4 mmol/L (ref 3.5–5.2)
Sodium: 141 mmol/L (ref 134–144)
TOTAL PROTEIN: 7.4 g/dL (ref 6.0–8.5)

## 2016-07-19 LAB — CBC WITH DIFFERENTIAL/PLATELET
BASOS: 0 %
Basophils Absolute: 0 10*3/uL (ref 0.0–0.2)
EOS (ABSOLUTE): 0.2 10*3/uL (ref 0.0–0.4)
EOS: 2 %
HEMATOCRIT: 43.2 % (ref 37.5–51.0)
HEMOGLOBIN: 15.4 g/dL (ref 12.6–17.7)
IMMATURE GRANS (ABS): 0 10*3/uL (ref 0.0–0.1)
IMMATURE GRANULOCYTES: 0 %
LYMPHS: 25 %
Lymphocytes Absolute: 2 10*3/uL (ref 0.7–3.1)
MCH: 31.4 pg (ref 26.6–33.0)
MCHC: 35.6 g/dL (ref 31.5–35.7)
MCV: 88 fL (ref 79–97)
MONOCYTES: 5 %
Monocytes Absolute: 0.4 10*3/uL (ref 0.1–0.9)
NEUTROS ABS: 5.3 10*3/uL (ref 1.4–7.0)
Neutrophils: 68 %
Platelets: 249 10*3/uL (ref 150–379)
RBC: 4.9 x10E6/uL (ref 4.14–5.80)
RDW: 12.9 % (ref 12.3–15.4)
WBC: 7.8 10*3/uL (ref 3.4–10.8)

## 2016-07-19 LAB — LIPASE: LIPASE: 18 U/L (ref 13–78)

## 2016-07-19 LAB — TSH: TSH: 1.87 u[IU]/mL (ref 0.450–4.500)

## 2016-07-19 LAB — AMYLASE: Amylase: 57 U/L (ref 31–124)

## 2016-07-19 NOTE — Telephone Encounter (Signed)
Pt advised.   Thanks,   -Taariq Leitz  

## 2016-07-19 NOTE — Telephone Encounter (Signed)
-----   Message from Jerrol Banana., MD sent at 07/19/2016  8:42 AM EDT ----- Labs normal.

## 2016-07-21 ENCOUNTER — Telehealth: Payer: Self-pay

## 2016-07-21 ENCOUNTER — Ambulatory Visit
Admission: RE | Admit: 2016-07-21 | Discharge: 2016-07-21 | Disposition: A | Discharge status: Home / Self Care | Payer: Managed Care, Other (non HMO) | Source: Ambulatory Visit | Attending: Family Medicine | Admitting: Family Medicine

## 2016-07-21 DIAGNOSIS — R932 Abnormal findings on diagnostic imaging of liver and biliary tract: Secondary | ICD-10-CM | POA: Diagnosis not present

## 2016-07-21 DIAGNOSIS — R109 Unspecified abdominal pain: Secondary | ICD-10-CM | POA: Insufficient documentation

## 2016-07-21 DIAGNOSIS — R1032 Left lower quadrant pain: Secondary | ICD-10-CM

## 2016-07-21 NOTE — Telephone Encounter (Signed)
Order put in and pt advised-aa

## 2016-07-21 NOTE — Telephone Encounter (Signed)
Yes--f/u with CT.

## 2016-07-21 NOTE — Telephone Encounter (Signed)
Pt advised of [place] results. Patient states what should he do. Should we do CT scan or just wait. He has discomfort/pain when he does not take Advil. He does not want to have to take this long term. What is your opinion he would like to know-aa

## 2016-07-26 ENCOUNTER — Telehealth: Payer: Self-pay | Admitting: Family Medicine

## 2016-07-26 DIAGNOSIS — R109 Unspecified abdominal pain: Secondary | ICD-10-CM

## 2016-07-26 NOTE — Telephone Encounter (Signed)
Can you please order to CT of abd/pelvis with contrast.See previous message

## 2016-07-26 NOTE — Telephone Encounter (Signed)
ok 

## 2016-07-26 NOTE — Telephone Encounter (Signed)
Please review-aa 

## 2016-07-26 NOTE — Telephone Encounter (Signed)
[name] from [facility] on behalf of Peter Ray called to see if you are willing to change CT of abd/pelvis with and without contrast to CT abd/pelvis WITH contrast only.This is approved.If you do not agree they would like a peer to peer.Phone # [phone].Case # [id number]

## 2016-07-27 ENCOUNTER — Telehealth: Payer: Self-pay | Admitting: Family Medicine

## 2016-07-27 MED ORDER — PREDNISONE 50 MG PO TABS: ORAL_TABLET | ORAL | 0 refills | Status: DC

## 2016-07-27 NOTE — Telephone Encounter (Signed)
Pt has an allergy to the CT contrast.Per [facility] pt will need a prescription of prednisone 50 MG sent to [facility] in [place].He will take 1 tablet 13 hr prior,7hr prior,1 hr prior.He also will need to take benedryl 50MG  1 hr prior.Pt has been advised of this

## 2016-07-27 NOTE — Telephone Encounter (Signed)
Please review-aa 

## 2016-07-27 NOTE — Telephone Encounter (Signed)
Prednisone sent in. Renaldo Fiddler, CMA

## 2016-07-27 NOTE — Telephone Encounter (Signed)
Order has been placed.

## 2016-07-27 NOTE — Telephone Encounter (Signed)
Ok to send this in? 

## 2016-08-04 ENCOUNTER — Ambulatory Visit
Admission: RE | Admit: 2016-08-04 | Discharge: 2016-08-04 | Disposition: A | Discharge status: Home / Self Care | Payer: Managed Care, Other (non HMO) | Source: Ambulatory Visit | Attending: Family Medicine | Admitting: Family Medicine

## 2016-08-04 DIAGNOSIS — R109 Unspecified abdominal pain: Secondary | ICD-10-CM | POA: Diagnosis not present

## 2016-08-04 DIAGNOSIS — K449 Diaphragmatic hernia without obstruction or gangrene: Secondary | ICD-10-CM | POA: Diagnosis not present

## 2016-08-04 MED ORDER — IOPAMIDOL (ISOVUE-300) INJECTION 61%
100.0000 mL | Freq: Once | INTRAVENOUS | Status: AC | PRN
  Administered 2016-08-04: 100 mL via INTRAVENOUS

## 2016-10-21 ENCOUNTER — Ambulatory Visit (INDEPENDENT_AMBULATORY_CARE_PROVIDER_SITE_OTHER): Payer: Managed Care, Other (non HMO) | Admitting: Family Medicine

## 2016-10-21 ENCOUNTER — Encounter: Payer: Self-pay | Admitting: Family Medicine

## 2016-10-21 VITALS — BP 142/92 | HR 82 | Temp 98.4°F | Resp 16 | Ht 72.0 in | Wt 247.4 lb

## 2016-10-21 DIAGNOSIS — Z8547 Personal history of malignant neoplasm of testis: Secondary | ICD-10-CM | POA: Diagnosis not present

## 2016-10-21 DIAGNOSIS — R030 Elevated blood-pressure reading, without diagnosis of hypertension: Secondary | ICD-10-CM | POA: Diagnosis not present

## 2016-10-21 DIAGNOSIS — Z0000 Encounter for general adult medical examination without abnormal findings: Secondary | ICD-10-CM

## 2016-10-21 NOTE — Progress Notes (Signed)
Patient: Peter Ray, Male    DOB: 01-10-1971, 46 y.o.   MRN: [id number] Visit Date: 10/21/2016  Today's Provider: Vernie Murders, PA   Chief Complaint  Patient presents with  . Annual Exam   Subjective:    Annual physical exam Peter Ray is a 46 y.o. male who presents today for health maintenance and complete physical. He feels fair. He reports exercising only at work. He reports he is sleeping well.  -----------------------------------------------------------------   Review of Systems  Constitutional: Positive for fatigue.  HENT: Negative.   Eyes: Negative.   Respiratory: Positive for shortness of breath.   Cardiovascular: Negative.   Gastrointestinal: Negative.   Endocrine: Negative.   Genitourinary: Negative.   Musculoskeletal: Negative.   Skin: Negative.   Allergic/Immunologic: Negative.   Neurological: Positive for light-headedness.  Hematological: Negative.   Psychiatric/Behavioral: Negative.     Social History      He  reports that he has never smoked. He has never used smokeless tobacco. He reports that he drinks alcohol. He reports that he does not use drugs.       Social History   Social History  . Marital status: Married    Spouse name: N/A  . Number of children: N/A  . Years of education: N/A   Social History Main Topics  . Smoking status: Never Smoker  . Smokeless tobacco: Never Used  . Alcohol use 0.0 oz/week     Comment: occasional   . Drug use: No  . Sexual activity: Not Asked   Other Topics Concern  . None   Social History Narrative  . None    Past Medical History:  Diagnosis Date  . Allergy   . Cancer ([place])    testicular ca. 12 yrs. radiation and surgery     Patient Active Problem List   Diagnosis Date Noted  . History of testicular cancer 07/18/2016  . Allergic rhinitis 08/13/2015  . Blood pressure elevated 08/13/2015  . Feeling stressed out 08/13/2015    Past Surgical History:  Procedure Laterality Date  .  TESTICLE REMOVAL Right 2001   secondary to cancer  . TONSILLECTOMY      Family History        Family Status  Relation Status  . Mother Alive  . Father Deceased  . Daughter Alive  . Son Alive  . Son Alive        His family history is not on file.     Allergies  Allergen Reactions  . Iodinated Diagnostic Agents     Contrast, 12 yrs ago hives, itching, difficulty breathing.   . Levofloxacin   . Sertraline Hives  . Shellfish Allergy   . Tree Extract     and grass  . Bupropion Rash    Also lips were a little swollen     Current Outpatient Prescriptions:  .  clonazePAM (KLONOPIN) 0.5 MG tablet, 1/2 to one twice daily as needed for stress/anxiety, Disp: 30 tablet, Rfl: 2 .  fexofenadine-pseudoephedrine (ALLEGRA-D) 60-120 MG 12 hr tablet, Take 1 tablet by mouth 2 (two) times daily., Disp: , Rfl:  .  fluticasone (FLONASE) 50 MCG/ACT nasal spray, Place into both nostrils daily., Disp: , Rfl:    Patient Care Team: Jerrol Banana., MD as PCP - General (Family Medicine)      Objective:   Vitals: BP (!) 142/92 (BP Location: Right Arm, Patient Position: Sitting, Cuff Size: Normal)   Pulse 82   Temp 98.4 F (  36.9 C) (Oral)   Resp 16   Ht 6' (1.829 m)   Wt 247 lb 6.4 oz (112.2 kg)   SpO2 96%   BMI 33.55 kg/m   BP Readings from Last 3 Encounters:  10/21/16 (!) 142/92  07/18/16 108/76  07/01/16 132/80    Physical Exam  Constitutional: He is oriented to person, place, and time. He appears well-developed and well-nourished.  HENT:  Head: Normocephalic and atraumatic.  Right Ear: External ear normal.  Left Ear: External ear normal.  Nose: Nose normal.  Mouth/Throat: Oropharynx is clear and moist.  Eyes: Conjunctivae and EOM are normal. Pupils are equal, round, and reactive to light. Right eye exhibits no discharge.  Neck: Normal range of motion. Neck supple. No tracheal deviation present. No thyromegaly present.  Cardiovascular: Normal rate, regular rhythm, normal  heart sounds and intact distal pulses.   No murmur heard. Pulmonary/Chest: Effort normal and breath sounds normal. No respiratory distress. He has no wheezes. He has no rales. He exhibits no tenderness.  Abdominal: Soft. He exhibits no distension and no mass. There is no tenderness. There is no rebound and no guarding.  Genitourinary:  Genitourinary Comments: Evaluation by Dr. Gilbert in October 2017.  Musculoskeletal: Normal range of motion. He exhibits no edema or tenderness.  Lymphadenopathy:    He has no cervical adenopathy.  Neurological: He is alert and oriented to person, place, and time. He has normal reflexes. No cranial nerve deficit. He exhibits normal muscle tone. Coordination normal.  Skin: Skin is warm and dry. No rash noted. No erythema.  Psychiatric: He has a normal mood and affect. His behavior is normal. Judgment and thought content normal.     Depression Screen PHQ 2/9 Scores 08/13/2015  PHQ - 2 Score 0  PHQ- 9 Score 2      Assessment & Plan:     Routine Health Maintenance and Physical Exam  Exercise Activities and Dietary recommendations Goals    Does a lot of lifting and climbing stairs at work. Recommend regular 30-40 minute exercise 3-4 times a week.      Immunization History  Administered Date(s) Administered  . DTaP 08/07/1974  . Hepatitis B 06/07/2011, 07/08/2011  . IPV 08/07/1974, 06/02/1981  . MMR 11/22/1972, 07/08/2011  . Td 03/31/1978, 06/02/1981, 08/18/1990  . Tdap 06/07/2011  . Varicella 06/10/2011, 07/08/2011    Health Maintenance  Topic Date Due  . HIV Screening  08/02/1986  . INFLUENZA VACCINE  10/21/2017 (Originally 05/10/2016)  . TETANUS/TDAP  06/06/2021     Discussed health benefits of physical activity, and encouraged him to engage in regular exercise appropriate for his age and condition.    --------------------------------------------------------------------  1. Annual physical exam Good general health. Needs to lose some  weight. Immunizations up to date. Labs on 07-18-16 were all normal. Recommend regular exercise.  2. History of testicular cancer No recurrence since removal of the right testicle in 2001. No dysuria or masses.  3. Elevated blood pressure reading Elevation today without symptoms of palpitations, chest pain or hypotension. Recommend sodium restriction, limit caffeine, limit ETOH and no tobacco use. Recheck BP in 2 months.   Dennis Chrismon, PA  [facility] 

## 2016-10-21 NOTE — Patient Instructions (Signed)
Preventing Hypertension °Hypertension, commonly called high blood pressure, is when the force of blood pumping through the arteries is too strong. Arteries are blood vessels that carry blood from the heart throughout the body. Over time, hypertension can damage the arteries and decrease blood flow to important parts of the body, including the brain, heart, and kidneys. Often, hypertension does not cause symptoms until blood pressure is very high. For this reason, it is important to have your blood pressure checked on a regular basis. °Hypertension can often be prevented with diet and lifestyle changes. If you already have hypertension, you can control it with diet and lifestyle changes, as well as medicine. °What nutrition changes can be made? °Maintain a healthy diet. This includes: °· Eating less salt (sodium). Ask your health care provider how much sodium is safe for you to have. The general recommendation is to consume less than 1 tsp (2,300 mg) of sodium a day. °? Do not add salt to your food. °? Choose low-sodium options when grocery shopping and eating out. °· Limiting fats in your diet. You can do this by eating low-fat or fat-free dairy products and by eating less red meat. °· Eating more fruits, vegetables, and whole grains. Make a goal to eat: °? 1½-2 cups of fresh fruits and vegetables each day. °? 3-4 servings of whole grains each day. °· Avoiding foods and beverages that have added sugars. °· Eating fish that contain healthy fats (omega-3 fatty acids), such as mackerel or salmon. ° °If you need help putting together a healthy eating plan, try the DASH diet. This diet is high in fruits, vegetables, and whole grains. It is low in sodium, red meat, and added sugars. DASH stands for Dietary Approaches to Stop Hypertension. °What lifestyle changes can be made? °· Lose weight if you are overweight. Losing just 3?5% of your body weight can help prevent or control hypertension. °? For example, if your present  weight is 200 lb (91 kg), a loss of 3-5% of your weight means losing 6-10 lb (2.7-4.5 kg). °? Ask your health care provider to help you with a diet and exercise plan to safely lose weight. °· Get enough exercise. Do at least 150 minutes of moderate-intensity exercise each week. °? You could do this in short exercise sessions several times a day, or you could do longer exercise sessions a few times a week. For example, you could take a brisk 10-minute walk or bike ride, 3 times a day, for 5 days a week. °· Find ways to reduce stress, such as exercising, meditating, listening to music, or taking a yoga class. If you need help reducing stress, ask your health care provider. °· Do not smoke. This includes e-cigarettes. Chemicals in tobacco and nicotine products raise your blood pressure each time you smoke. If you need help quitting, ask your health care provider. °· Avoid alcohol. If you drink alcohol, limit alcohol intake to no more than 1 drink a day for nonpregnant women and 2 drinks a day for men. One drink equals 12 oz of beer, 5 oz of wine, or 1½ oz of hard liquor. °Why are these changes important? °Diet and lifestyle changes can help you prevent hypertension, and they may make you feel better overall and improve your quality of life. If you have hypertension, making these changes will help you control it and help prevent major complications, such as: °· Hardening and narrowing of arteries that supply blood to: °? Your heart. This can cause a heart   attack. °? Your brain. This can cause a stroke. °? Your kidneys. This can cause kidney failure. °· Stress on your heart muscle, which can cause heart failure. ° °What can I do to lower my risk? °· Work with your health care provider to make a hypertension prevention plan that works for you. Follow your plan and keep all follow-up visits as told by your health care provider. °· Learn how to check your blood pressure at home. Make sure that you know your personal target  blood pressure, as told by your health care provider. °How is this treated? °In addition to diet and lifestyle changes, your health care provider may recommend medicines to help lower your blood pressure. You may need to try a few different medicines to find what works best for you. You also may need to take more than one medicine. Take over-the-counter and prescription medicines only as told by your health care provider. °Where to find support: °Your health care provider can help you prevent hypertension and help you keep your blood pressure at a healthy level. Your local hospital or your community may also provide support services and prevention programs. °The American Heart Association offers an online support network at: http://supportnetwork.heart.org/high-blood-pressure °Where to find more information: °Learn more about hypertension from: °· National Heart, Lung, and Blood Institute: www.nhlbi.nih.gov/health/health-topics/topics/hbp °· Centers for Disease Control and Prevention: www.cdc.gov/bloodpressure °· American Academy of Family Physicians: http://familydoctor.org/familydoctor/en/diseases-conditions/high-blood-pressure.printerview.all.html ° °Learn more about the DASH diet from: °· National Heart, Lung, and Blood Institute: www.nhlbi.nih.gov/health/health-topics/topics/dash ° °Contact a health care provider if: °· You think you are having a reaction to medicines you have taken. °· You have recurrent headaches or feel dizzy. °· You have swelling in your ankles. °· You have trouble with your vision. °Summary °· Hypertension often does not cause any symptoms until blood pressure is very high. It is important to get your blood pressure checked regularly. °· Diet and lifestyle changes are the most important steps in preventing hypertension. °· By keeping your blood pressure in a healthy range, you can prevent complications like heart attack, heart failure, stroke, and kidney failure. °· Work with your health  care provider to make a hypertension prevention plan that works for you. °This information is not intended to replace advice given to you by your health care provider. Make sure you discuss any questions you have with your health care provider. °Document Released: 10/11/2015 Document Revised: 06/06/2016 Document Reviewed: 06/06/2016 °Elsevier Interactive Patient Education © 2017 [employer]. ° °

## 2016-12-23 ENCOUNTER — Ambulatory Visit (INDEPENDENT_AMBULATORY_CARE_PROVIDER_SITE_OTHER): Payer: Managed Care, Other (non HMO) | Admitting: Family Medicine

## 2016-12-23 ENCOUNTER — Encounter: Payer: Self-pay | Admitting: Family Medicine

## 2016-12-23 VITALS — BP 120/74 | HR 76 | Temp 98.2°F | Resp 16 | Wt 245.0 lb

## 2016-12-23 DIAGNOSIS — G44209 Tension-type headache, unspecified, not intractable: Secondary | ICD-10-CM

## 2016-12-23 DIAGNOSIS — R030 Elevated blood-pressure reading, without diagnosis of hypertension: Secondary | ICD-10-CM

## 2016-12-23 NOTE — Patient Instructions (Signed)

## 2016-12-23 NOTE — Progress Notes (Signed)
Patient: Peter Ray Male    DOB: 07/13/1971   46 y.o.   MRN: [id number] Visit Date: 12/23/2016  Today's Provider: Vernie Murders, PA   Chief Complaint  Patient presents with  . Elevated Blood Pressure   Subjective:    HPI     Follow up for Elevated Blood Pressure without Diagnosis of Hypertension  The patient was last seen for this 2 months ago. Changes made at last visit include recommending salt restriction, limit caffeine and ETOH. No tobacco use.  He reports excellent compliance with treatment. Pt has lost 2 pounds since LOV. Pt is eating better, and limiting caffeine. He feels that condition is Unchanged. Pt is checking BP outside of the office, and usually reads 130's-140's/80's-90's. Pt reports he is very stressed out today. 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 Pt is c/o headache currently.  BP Readings from Last 3 Encounters:  12/23/16 120/74  10/21/16 (!) 142/92  07/18/16 108/76    ------------------------------------------------------------------------------------  Patient Active Problem List   Diagnosis Date Noted  . History of testicular cancer 07/18/2016  . Allergic rhinitis 08/13/2015  . Elevated blood pressure reading 08/13/2015  . Feeling stressed out 08/13/2015   Past Surgical History:  Procedure Laterality Date  . TESTICLE REMOVAL Right 2001   secondary to cancer  . TONSILLECTOMY     History reviewed. No pertinent family history.  Allergies  Allergen Reactions  . Iodinated Diagnostic Agents     Contrast, 12 yrs ago hives, itching, difficulty breathing.   . Levofloxacin   . Sertraline Hives  . Shellfish Allergy   . Tree Extract     and grass  . Bupropion Rash    Also lips were a little swollen     Current Outpatient Prescriptions:  .  fexofenadine-pseudoephedrine (ALLEGRA-D) 60-120 MG 12 hr tablet, Take 1 tablet by mouth 2 (two) times daily., Disp: , Rfl:  .  fluticasone (FLONASE) 50  MCG/ACT nasal spray, Place into both nostrils daily., Disp: , Rfl:  .  clonazePAM (KLONOPIN) 0.5 MG tablet, 1/2 to one twice daily as needed for stress/anxiety (Patient not taking: Reported on 12/23/2016), Disp: 30 tablet, Rfl: 2  Review of Systems  Constitutional: Negative for activity change, appetite change, chills, diaphoresis, fatigue, fever and unexpected weight change.  Respiratory: Negative for shortness of breath.   Cardiovascular: Negative for chest pain, palpitations and leg swelling.  Gastrointestinal: Negative for nausea and vomiting.  Musculoskeletal: Negative for neck pain.  Neurological: Positive for headaches. Negative for dizziness.    Social History  Substance Use Topics  . Smoking status: Never Smoker  . Smokeless tobacco: Never Used  . Alcohol use 0.0 oz/week     Comment: occasional    Objective:   BP 120/74 (BP Location: Right Arm, Patient Position: Sitting, Cuff Size: Large)   Pulse 76   Temp 98.2 F (36.8 C) (Oral)   Resp 16   Wt 245 lb (111.1 kg)   BMI 33.23 kg/m  Vitals:   12/23/16 1614  BP: 120/74  Pulse: 76  Resp: 16  Temp: 98.2 F (36.8 C)  TempSrc: Oral  Weight: 245 lb (111.1 kg)   Physical Exam  Constitutional: He is oriented to person, place, and time. He appears well-developed and well-nourished. No distress.  HENT:  Head: Normocephalic and atraumatic.  Right Ear: Hearing normal.  Left Ear: Hearing normal.  Nose: Nose normal.  Eyes: Conjunctivae and lids are normal. Right  eye exhibits no discharge. Left eye exhibits no discharge. No scleral icterus.  Cardiovascular: Normal rate and regular rhythm.   Pulmonary/Chest: Effort normal and breath sounds normal. No respiratory distress.  Abdominal: Soft. Bowel sounds are normal.  Musculoskeletal: Normal range of motion.  Neurological: He is alert and oriented to person, place, and time.  Skin: Skin is intact. No lesion and no rash noted.  Psychiatric: He has a normal mood and affect. His  speech is normal and behavior is normal. Thought content normal.      Assessment & Plan:     1. Elevated blood pressure reading Has limited sodium and caffeine in diet. Feeling well and BP much improved today. May have been elevated due to stresses. Recommend regular exercise and recheck prn.  2. Tension headache Some soreness in the posterior neck and pressure headache over the past week. More stressful situations at work. May try Percogesic and recheck prn.     Patient seen and examined by Vernie Murders, PA, and note scribed by Renaldo Fiddler, CMA.  Vernie Murders, PA  [facility]

## 2017-11-08 ENCOUNTER — Other Ambulatory Visit: Payer: Self-pay

## 2017-11-08 ENCOUNTER — Ambulatory Visit: Payer: Managed Care, Other (non HMO) | Admitting: Family Medicine

## 2017-11-08 VITALS — BP 130/88 | HR 76 | Temp 97.9°F | Resp 16 | Wt 247.0 lb

## 2017-11-08 DIAGNOSIS — I10 Essential (primary) hypertension: Secondary | ICD-10-CM | POA: Diagnosis not present

## 2017-11-08 MED ORDER — AMLODIPINE BESYLATE 5 MG PO TABS: 5.0000 mg | ORAL_TABLET | Freq: Every day | ORAL | 3 refills | Status: DC

## 2017-11-08 NOTE — Progress Notes (Signed)
Peter Ray  MRN: [id number] DOB: 02/19/1971  Subjective:  HPI   The patient is a 47 year old male who presents today for elevated blood pressure.  The patient states he periodically checks his blood pressure and had noticed that it was gradually increasing.  However last week he was having headaches and over the weekend had a little bit of tingling in his hands.  He checked his pressure and he had the highest reading at 162/99.  He said that it had mainly been running 150's-160's over 80's-90's. He checked it today and it was 137/88.  He also reports no headache or tingling today.   The patient is currently not on any anti-hypertensive and would really like to try and not take anything.  He said that he has already started to make changes in his habits. He admits that he has gained some more weight.  The patient also admits that last week was his wife's follow up for her breast cancer.  She has been cancer free for about 3 years but when they have the appointment he said the week is very stressful.  Patient Active Problem List   Diagnosis Date Noted  . History of testicular cancer 07/18/2016  . Allergic rhinitis 08/13/2015  . Elevated blood pressure reading 08/13/2015  . Feeling stressed out 08/13/2015    Past Medical History:  Diagnosis Date  . Allergy   . Cancer ([place])    testicular ca. 12 yrs. radiation and surgery    Social History   Socioeconomic History  . Marital status: Married    Spouse name: Not on file  . Number of children: Not on file  . Years of education: Not on file  . Highest education level: Not on file  Social Needs  . Financial resource strain: Not on file  . Food insecurity - worry: Not on file  . Food insecurity - inability: Not on file  . Transportation needs - medical: Not on file  . Transportation needs - non-medical: Not on file  Occupational History  . Not on file  Tobacco Use  . Smoking status: Never Smoker  . Smokeless tobacco: Never Used    Substance and Sexual Activity  . Alcohol use: Yes    Alcohol/week: 0.0 oz    Comment: occasional   . Drug use: No  . Sexual activity: Not on file  Other Topics Concern  . Not on file  Social History Narrative  . Not on file    Outpatient Encounter Medications as of 11/08/2017  Medication Sig  . fexofenadine-pseudoephedrine (ALLEGRA-D) 60-120 MG 12 hr tablet Take 1 tablet by mouth 2 (two) times daily.  . fluticasone (FLONASE) 50 MCG/ACT nasal spray Place into both nostrils daily.  . [DISCONTINUED] clonazePAM (KLONOPIN) 0.5 MG tablet 1/2 to one twice daily as needed for stress/anxiety (Patient not taking: Reported on 12/23/2016)   No facility-administered encounter medications on file as of 11/08/2017.     Allergies  Allergen Reactions  . Iodinated Diagnostic Agents     Contrast, 12 yrs ago hives, itching, difficulty breathing.   . Levofloxacin   . Sertraline Hives  . Shellfish Allergy   . Tree Extract     and grass  . Bupropion Rash    Also lips were a little swollen    Review of Systems  Constitutional: Negative for fever and malaise/fatigue.  HENT: Negative.   Eyes: Negative.   Respiratory: Positive for shortness of breath (Only when he knows his blood pressure is  high). Negative for cough and wheezing.   Cardiovascular: Negative for chest pain, palpitations, orthopnea, claudication and leg swelling.  Skin: Negative.   Neurological: Positive for dizziness (1 or 2 times when the pressure was the highest.), tingling and headaches. Negative for weakness.  Psychiatric/Behavioral: The patient is nervous/anxious.     Objective:  BP 130/88 (BP Location: Right Arm, Patient Position: Sitting, Cuff Size: Normal)   Pulse 76   Temp 97.9 F (36.6 C) (Oral)   Resp 16   Wt 247 lb (112 kg)   BMI 33.50 kg/m   Physical Exam  Constitutional: He is oriented to person, place, and time and well-developed, well-nourished, and in no distress.  HENT:  Head: Normocephalic and  atraumatic.  Right Ear: External ear normal.  Left Ear: External ear normal.  Nose: Nose normal.  Eyes: Conjunctivae are normal. Pupils are equal, round, and reactive to light. No scleral icterus.  Neck: Normal range of motion. No thyromegaly present.  Cardiovascular: Normal rate, regular rhythm and normal heart sounds.  Pulmonary/Chest: Effort normal and breath sounds normal.  Abdominal: Soft.  Neurological: He is alert and oriented to person, place, and time. GCS score is 15.  Skin: Skin is warm and dry.  Psychiatric: Mood, memory, affect and judgment normal.    Assessment and Plan :   1. Hypertension, unspecified type  - EKG 12-Lead  2. Essential hypertension  - amLODipine (NORVASC) 5 MG tablet; Take 1 tablet (5 mg total) by mouth daily.  Dispense: 90 tablet; Refill: 3 3.Adjustment Reaction  I have reviewed the health advisors note, was  available for consultation and I agree with documentation and plan. Miguel Aschoff MD [facility]

## 2017-12-07 ENCOUNTER — Ambulatory Visit: Payer: Self-pay | Admitting: Family Medicine

## 2017-12-11 ENCOUNTER — Ambulatory Visit: Payer: Self-pay | Admitting: Family Medicine

## 2017-12-19 ENCOUNTER — Encounter: Payer: Self-pay | Admitting: Family Medicine

## 2017-12-19 ENCOUNTER — Ambulatory Visit: Payer: Managed Care, Other (non HMO) | Admitting: Family Medicine

## 2017-12-19 VITALS — BP 122/72 | HR 78 | Temp 97.8°F | Resp 16

## 2017-12-19 DIAGNOSIS — I10 Essential (primary) hypertension: Secondary | ICD-10-CM | POA: Diagnosis not present

## 2017-12-19 DIAGNOSIS — B078 Other viral warts: Secondary | ICD-10-CM

## 2017-12-19 NOTE — Progress Notes (Signed)
Patient: Peter Ray Male    DOB: 1971/05/03   47 y.o.   MRN: [id number] Visit Date: 12/19/2017  Today's Provider: [name], MD   Chief Complaint  Patient presents with  . Hypertension   Subjective:    HPI  Hypertension, follow-up:  BP Readings from Last 3 Encounters:  12/19/17 122/72  11/08/17 130/88  12/23/16 120/74    He was last seen for hypertension 1 months ago.  BP at that visit was 130/88. Management since that visit includes started amlodipine. He reports good compliance with treatment. He is not having side effects.  He is exercising, daily. He is adherent to low salt diet.   Outside blood pressures are not checking her blood pressure. Patient denies chest pain, chest pressure/discomfort, claudication, dyspnea, exertional chest pressure/discomfort, fatigue, irregular heart beat, lower extremity edema, near-syncope, orthopnea, palpitations, paroxysmal nocturnal dyspnea, syncope and tachypnea.   Wt Readings from Last 3 Encounters:  11/08/17 247 lb (112 kg)  12/23/16 245 lb (111.1 kg)  10/21/16 247 lb 6.4 oz (112.2 kg)   ------------------------------------------------------------------------    Allergies  Allergen Reactions  . Iodinated Diagnostic Agents     Contrast, 12 yrs ago hives, itching, difficulty breathing.   . Levofloxacin   . Sertraline Hives  . Shellfish Allergy   . Tree Extract     and grass  . Bupropion Rash    Also lips were a little swollen     Current Outpatient Medications:  .  amLODipine (NORVASC) 5 MG tablet, Take 1 tablet (5 mg total) by mouth daily., Disp: 90 tablet, Rfl: 3 .  fexofenadine (ALLEGRA) 60 MG tablet, Take 60 mg by mouth 2 (two) times daily., Disp: , Rfl:  .  fluticasone (FLONASE) 50 MCG/ACT nasal spray, Place into both nostrils daily as needed. , Disp: , Rfl:   Review of Systems  Constitutional: Negative.   HENT: Negative.   Eyes: Positive for discharge and itching.  Respiratory: Negative.     Cardiovascular: Negative.   Gastrointestinal: Negative.   Endocrine: Negative.   Genitourinary: Negative.   Musculoskeletal: Positive for arthralgias.  Skin: Negative.   Allergic/Immunologic: Negative.   Neurological: Negative.   Hematological: Negative.   Psychiatric/Behavioral: Negative.     Social History   Tobacco Use  . Smoking status: Never Smoker  . Smokeless tobacco: Never Used  Substance Use Topics  . Alcohol use: Yes    Alcohol/week: 0.0 oz    Comment: occasional    Objective:   BP 122/72 (BP Location: Left Arm, Patient Position: Sitting, Cuff Size: Large)   Pulse 78   Temp 97.8 F (36.6 C) (Oral)   Resp 16  Vitals:   12/19/17 1550  BP: 122/72  Pulse: 78  Resp: 16  Temp: 97.8 F (36.6 C)  TempSrc: Oral     Physical Exam  Constitutional: He is oriented to person, place, and time. He appears well-developed and well-nourished.  HENT:  Head: Normocephalic and atraumatic.  Right Ear: External ear normal.  Left Ear: External ear normal.  Nose: Nose normal.  Mouth/Throat: Oropharynx is clear and moist.  Eyes: Conjunctivae are normal. No scleral icterus.  Neck: No thyromegaly present.  Cardiovascular: Normal rate, regular rhythm and normal heart sounds.  Pulmonary/Chest: Effort normal and breath sounds normal.  Abdominal: Soft.  Lymphadenopathy:    He has no cervical adenopathy.  Neurological: He is alert and oriented to person, place, and time.  Skin: Skin is warm and dry.  Psychiatric: He  has a normal mood and affect. His behavior is normal. Judgment and thought content normal.        Assessment & Plan:     1. Essential hypertension   2. Flat wart/facial bumps  - Ambulatory referral to Dermatology 3.Chronic Anxiety  I have done the exam and reviewed the chart and it is accurate to the best of my knowledge. Development worker, community has been used and  any errors in dictation or transcription are unintentional. Miguel Aschoff M.D. [place], MD  [facility]

## 2018-01-05 ENCOUNTER — Ambulatory Visit: Payer: Self-pay | Admitting: Family Medicine

## 2018-01-05 VITALS — BP 135/84 | HR 93 | Temp 99.3°F | Resp 18 | Ht 72.0 in | Wt 239.0 lb

## 2018-01-05 DIAGNOSIS — Z008 Encounter for other general examination: Secondary | ICD-10-CM

## 2018-01-05 DIAGNOSIS — Z0189 Encounter for other specified special examinations: Principal | ICD-10-CM

## 2018-01-05 LAB — GLUCOSE, POCT (MANUAL RESULT ENTRY): POC GLUCOSE: 108 mg/dL — AB (ref 70–99)

## 2018-01-05 NOTE — Progress Notes (Signed)
Subjective: Annual biometrics screening  Patient presents for his annual biometric screening. Patient has a history of hypertension, treated with amlodipine.  Patient also has a history of allergic rhinitis treated with Allegra as needed and Flonase.  Patient reports that he has been trying to eat a healthy, well-balanced diet and decrease the amount of stress in his life. PCP: Dr. Rosanna Randy. Patient works with groundskeeping and maintenance. Patient denies any other issues or concerns.   Review of Systems Constitutional: Unremarkable.  HEENT: Unremarkable Gastrointestinal: Unremarkable Respiratory: Unremarkable.   Cardiovascular: Unremarkable.  ROS otherwise negative.   Objective  Physical Exam General: Awake, alert and oriented. No acute distress. Well developed, hydrated and nourished. Appears stated age.  HEENT:  Supple neck without adenopathy. Sclera is non-icteric. The ear canal is clear without discharge. The tympanic membrane is normal in appearance with normal landmarks and cone of light. Nasal mucosa is pink and moist. Oral mucosa is pink and moist. The pharynx is normal in appearance without tonsillar swelling or exudates.  Skin: Skin in warm, dry and intact without rashes or lesions. Appropriate color for ethnicity. Cardiac: Heart rate and rhythm are normal. No murmurs, gallops, or rubs are auscultated.  Respiratory: The chest wall is symmetric and without deformity. No signs of respiratory distress. Lung sounds are clear in all lobes bilaterally without rales, ronchi, or wheezes.  Abdominal: Abdomen is soft, symmetric, and non-tender without distention. No masses, hepatomegaly, or splenomegaly are noted.  Neurological: The patient is awake, alert and oriented to person, place, and time with normal speech.  Memory is normal and thought processes intact. No gait abnormalities are appreciated.  Psychiatric: Appropriate mood and affect.   Assessment Annual biometrics screen  Plan   Labs pending. Encouraged routine visits with primary care provider.  Patient reports eating a donut just prior to arrival, nonfasting blood sugar shortly after that 108.

## 2018-01-05 NOTE — Progress Notes (Signed)
Pt. Stated PCP would like copy of Lipid profile results.

## 2018-01-06 LAB — LIPID PANEL
CHOL/HDL RATIO: 5 ratio (ref 0.0–5.0)
Cholesterol, Total: 206 mg/dL — ABNORMAL HIGH (ref 100–199)
HDL: 41 mg/dL (ref >39)
LDL Calculated: 115 mg/dL — ABNORMAL HIGH (ref 0–99)
Triglycerides: 248 mg/dL — ABNORMAL HIGH (ref 0–149)
VLDL CHOLESTEROL CAL: 50 mg/dL — AB (ref 5–40)

## 2018-01-08 NOTE — Progress Notes (Signed)
Dear Peter Ray, I wanted to let you know that your lipid panel came back.  Your total cholesterol is 206, normal values are between 100 and 199.  Your triglyceride level is 248, normal values are between 0 and 149.  Your VLDL cholesterol is 50, normal values are between 5 and 40. Your HDL cholesterol ("good cholesterol") is 41, normal values are above 39.  Your LDL cholesterol ("bad cholesterol") is 115, normal values are below 99.  Your cholesterol/HDL ratio is 5, normal values are between 0 and 4.4 for women or 0 and 5 from men.  These abnormal values increase your risk for cardiovascular disease.  I wanted you to be aware of these results so that you can discuss this with your primary care provider at your next regularly scheduled visit.

## 2018-04-16 ENCOUNTER — Telehealth: Payer: Self-pay | Admitting: Family Medicine

## 2018-04-16 NOTE — Telephone Encounter (Signed)
Check BP at least daily and f/u in next 1-2 weeks.

## 2018-04-16 NOTE — Telephone Encounter (Signed)
Spoke with the patient and he reports that his BP was 151/98 yesterday. He reports that the tingling that he has in his fingers comes and goes. He has the sensation in both hands and sometimes it radiates up to both arms. He denies chest pain, shortness of breath, weakness, or numbness. He has noticed some swelling in his ankles.   Patient wanted to know if this is a side effect from the medication? Will symptoms subside over time? Please advise. Thanks!

## 2018-04-16 NOTE — Telephone Encounter (Signed)
Advised patient as below. Appt scheduled in 2 weeks for recheck.

## 2018-04-16 NOTE — Telephone Encounter (Signed)
Pt called saying he is taking a beta blocker and is having a tingling in his fingers for about a month./  He wants to know if this may be a side effect from this medication  BP is usually around 51/98   CB#  [phone]  [name]

## 2018-05-03 ENCOUNTER — Encounter: Payer: Self-pay | Admitting: Family Medicine

## 2018-05-03 ENCOUNTER — Ambulatory Visit: Payer: Managed Care, Other (non HMO) | Admitting: Family Medicine

## 2018-05-03 VITALS — BP 148/82 | HR 70 | Temp 98.7°F | Resp 16 | Wt 239.0 lb

## 2018-05-03 DIAGNOSIS — I10 Essential (primary) hypertension: Secondary | ICD-10-CM

## 2018-05-03 DIAGNOSIS — F411 Generalized anxiety disorder: Secondary | ICD-10-CM

## 2018-05-03 DIAGNOSIS — Z8547 Personal history of malignant neoplasm of testis: Secondary | ICD-10-CM

## 2018-05-03 MED ORDER — LISINOPRIL 10 MG PO TABS: 10.0000 mg | ORAL_TABLET | Freq: Every day | ORAL | 3 refills | Status: DC

## 2018-05-03 NOTE — Progress Notes (Signed)
Patient: Peter Ray Male    DOB: 08/27/1971   47 y.o.   MRN: [id number] Visit Date: 05/03/2018  Today's Provider: [name], MD   Chief Complaint  Patient presents with  . Hypertension   Subjective:    HPI Patient comes in today for a follow up on hypertension. He has been on amlodipine 5mg  for about 5 months, but he reports that in the last 2 weeks, his BP has been elevated. He reports that his BP at home is averaging in the 150s/90s.   He reports that he has a tingling sensation that comes and goes in his hands and fingers. He also has occasional swelling in his ankles. He denies chest pain, shortness of breath, or dizziness.  He reports that he has had increased stress lately dealing with the passing of his uncle. He has decreased salt in his diet, decreased caffeine, and tries not to get upset.   BP Readings from Last 3 Encounters:  05/03/18 (!) 148/82  01/05/18 135/84  12/19/17 122/72       Allergies  Allergen Reactions  . Iodinated Diagnostic Agents     Contrast, 12 yrs ago hives, itching, difficulty breathing.   . Levofloxacin   . Sertraline Hives  . Shellfish Allergy   . Tree Extract     and grass  . Bupropion Rash    Also lips were a little swollen     Current Outpatient Medications:  .  amLODipine (NORVASC) 5 MG tablet, Take 1 tablet (5 mg total) by mouth daily., Disp: 90 tablet, Rfl: 3 .  fexofenadine (ALLEGRA) 60 MG tablet, Take 60 mg by mouth 2 (two) times daily., Disp: , Rfl:  .  fluticasone (FLONASE) 50 MCG/ACT nasal spray, Place into both nostrils daily as needed. , Disp: , Rfl:   Review of Systems  Constitutional: Negative for activity change, appetite change, chills, diaphoresis, fatigue, fever and unexpected weight change.  HENT: Negative for congestion and sinus pressure.   Eyes: Negative.   Respiratory: Negative for cough and shortness of breath.   Cardiovascular: Positive for leg swelling. Negative for chest pain and  palpitations.  Endocrine: Negative.   Musculoskeletal: Negative for arthralgias.  Skin: Negative for color change, pallor, rash and wound.  Allergic/Immunologic: Negative for environmental allergies.  Neurological: Positive for light-headedness and numbness. Negative for dizziness, facial asymmetry and headaches.  Psychiatric/Behavioral: Positive for agitation, decreased concentration and sleep disturbance. Negative for self-injury and suicidal ideas. The patient is nervous/anxious.     Social History   Tobacco Use  . Smoking status: Never Smoker  . Smokeless tobacco: Never Used  Substance Use Topics  . Alcohol use: Yes    Alcohol/week: 0.0 oz    Comment: occasional    Objective:   BP (!) 148/82 (BP Location: Right Arm, Patient Position: Sitting, Cuff Size: Large)   Pulse 70   Temp 98.7 F (37.1 C)   Resp 16   Wt 239 lb (108.4 kg)   SpO2 98%   BMI 32.41 kg/m  Vitals:   05/03/18 1559  BP: (!) 148/82  Pulse: 70  Resp: 16  Temp: 98.7 F (37.1 C)  SpO2: 98%  Weight: 239 lb (108.4 kg)     Physical Exam  Constitutional: He is oriented to person, place, and time. He appears well-developed and well-nourished.  HENT:  Head: Normocephalic and atraumatic.  Right Ear: External ear normal.  Left Ear: External ear normal.  Nose: Nose normal.  Eyes: Conjunctivae  are normal. No scleral icterus.  Neck: No thyromegaly present.  Cardiovascular: Normal rate, regular rhythm and normal heart sounds.  Pulmonary/Chest: Effort normal and breath sounds normal.  Abdominal: Soft.  Musculoskeletal: He exhibits no edema.  Neurological: He is alert and oriented to person, place, and time.  Skin: Skin is warm and dry.  Psychiatric: He has a normal mood and affect. His behavior is normal. Judgment and thought content normal.        Assessment & Plan:     1. Essential hypertension  - lisinopril (PRINIVIL,ZESTRIL) 10 MG tablet; Take 1 tablet (10 mg total) by mouth daily.  Dispense: 30  tablet; Refill: 3 2.Chronic Anxiety     I have done the exam and reviewed the above chart and it is accurate to the best of my knowledge. Development worker, community has been used in this note in any air is in the dictation or transcription are unintentional.  [name], MD  [place]

## 2018-06-07 ENCOUNTER — Encounter: Payer: Self-pay | Admitting: Family Medicine

## 2018-06-07 ENCOUNTER — Ambulatory Visit: Payer: Managed Care, Other (non HMO) | Admitting: Family Medicine

## 2018-06-07 VITALS — BP 114/68 | HR 88 | Temp 98.7°F | Wt 241.0 lb

## 2018-06-07 DIAGNOSIS — R030 Elevated blood-pressure reading, without diagnosis of hypertension: Secondary | ICD-10-CM | POA: Diagnosis not present

## 2018-06-07 DIAGNOSIS — I10 Essential (primary) hypertension: Secondary | ICD-10-CM | POA: Diagnosis not present

## 2018-06-07 IMAGING — CT CT ABD-PELV W/ CM
2 of 5 series · 15 of 46 positions shown, 17 images · IV contrast (APPLIED)
Comparison: Abdominal ultrasound 07/21/2016

CLINICAL DATA: Left side abdominal pain, left flank pain starting
[DATE], history of testicular cancer 12 years ago treated with
surgery and radiation therapy

EXAM:
CT ABDOMEN AND PELVIS WITH CONTRAST
TECHNIQUE: Multidetector CT imaging of the abdomen and pelvis was performed
using the standard protocol following bolus administration of
intravenous contrast.
CONTRAST:  100mL SRX0RG-JSS IOPAMIDOL (SRX0RG-JSS) INJECTION 61%

[Series 2: axial st · axial · 0.75mm/px · z∈[-564,-104]mm · 12 of 104 slices shown, 14 images]
[im 6/104  soft-tissue]
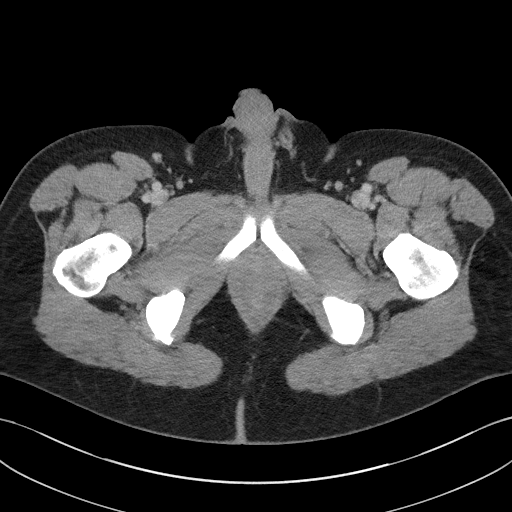
[im 6/104  bone]
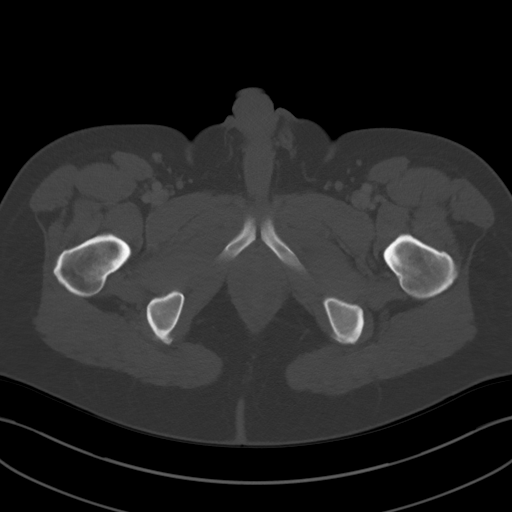
[im 17/104  soft-tissue]
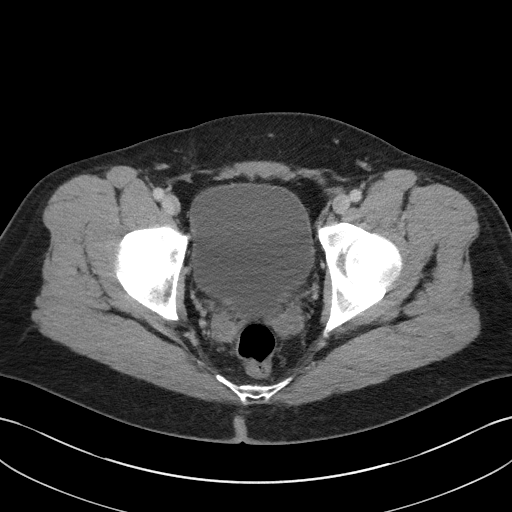
[im 22/104  soft-tissue]
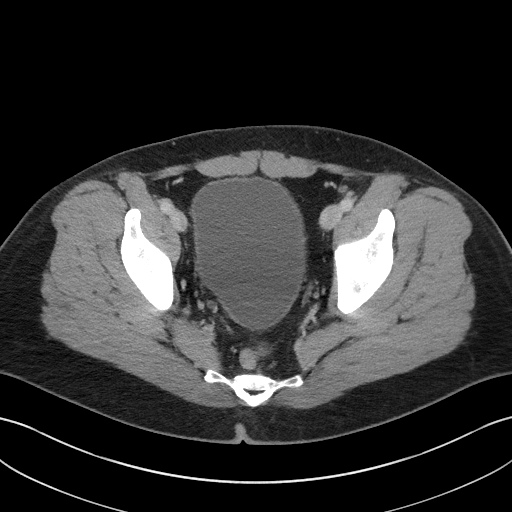
[im 33/104  soft-tissue]
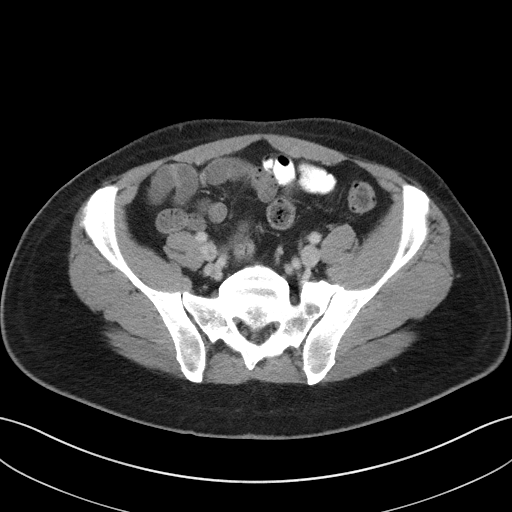
[im 38/104  soft-tissue]
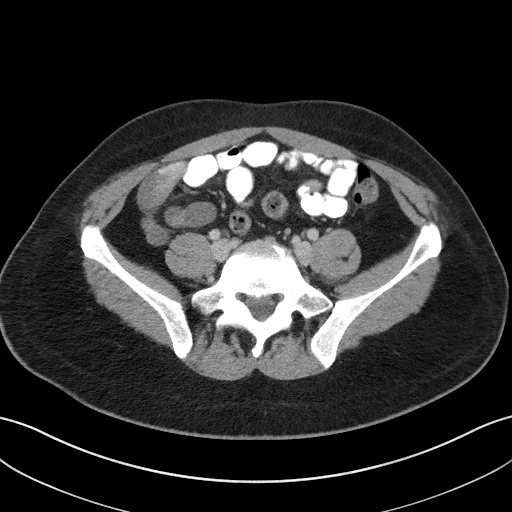
[im 49/104  soft-tissue]
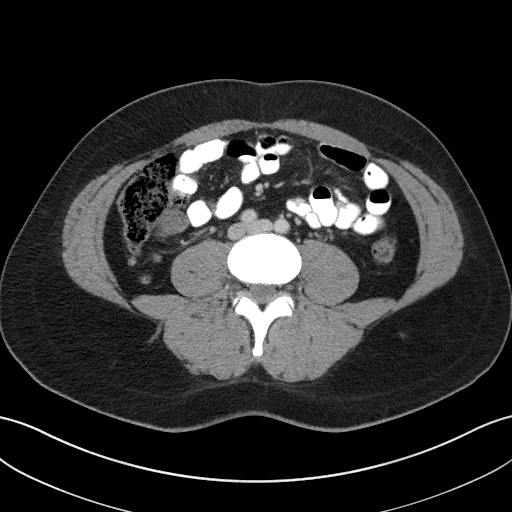
[im 55/104  soft-tissue]
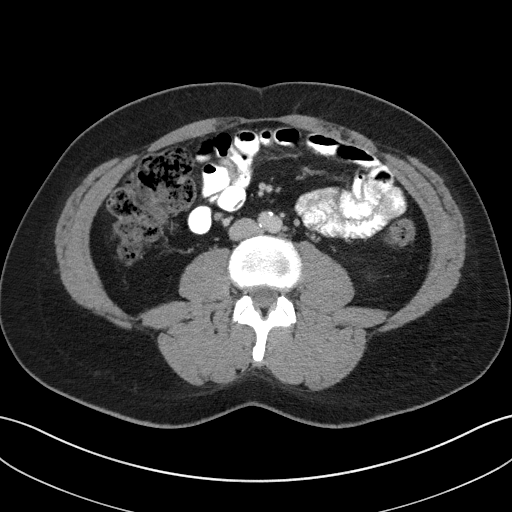
[im 66/104  soft-tissue]
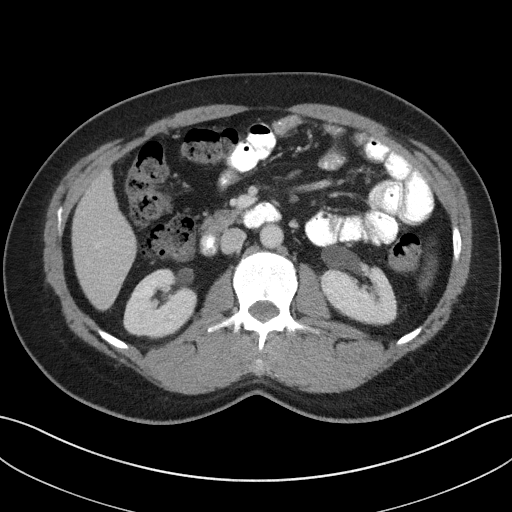
[im 71/104  soft-tissue]
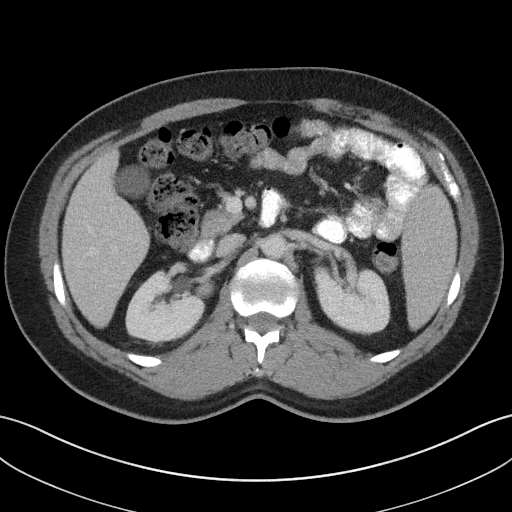
[im 71/104  bone]
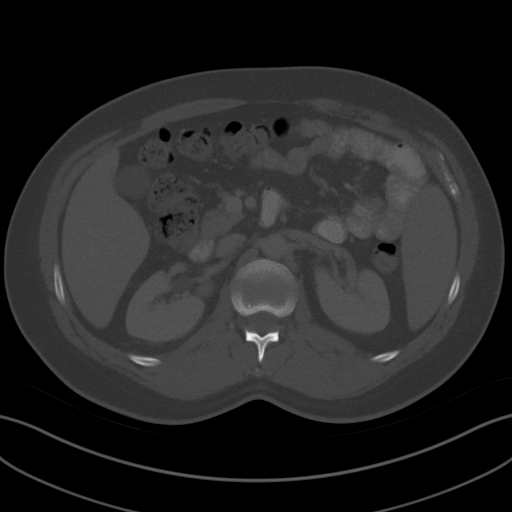
[im 82/104  soft-tissue]
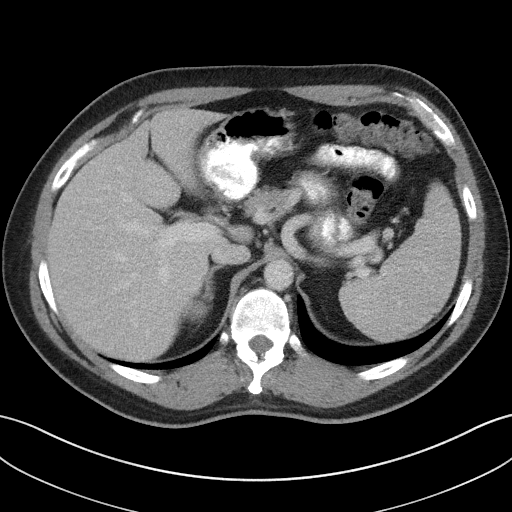
[im 87/104  soft-tissue]
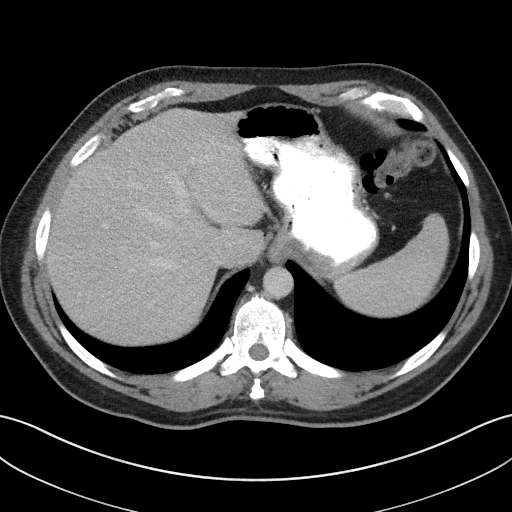
[im 98/104  soft-tissue]
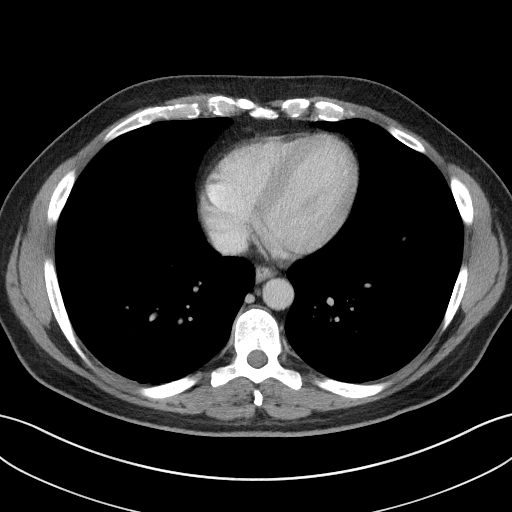

[Series 5: coronal st · coronal · 0.75mm/px · 3 of 92 slices shown]
[im 31/92  soft-tissue]
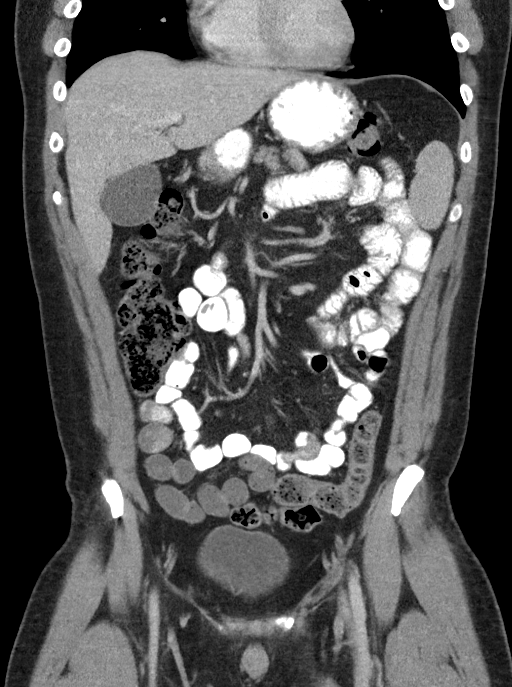
[im 41/92  soft-tissue]
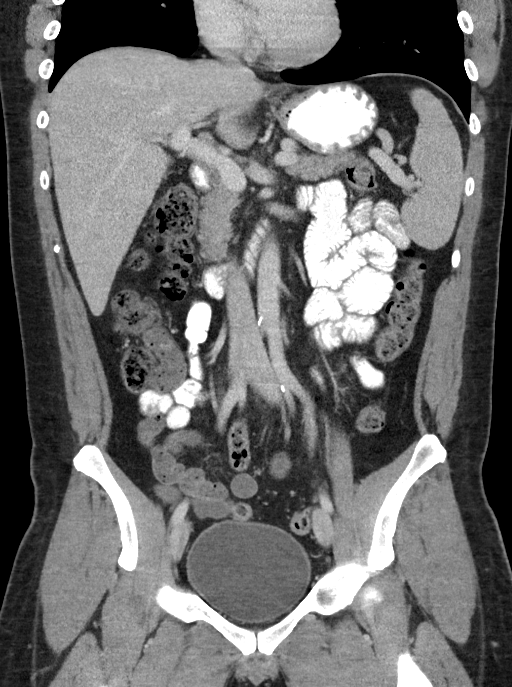
[im 51/92  soft-tissue]
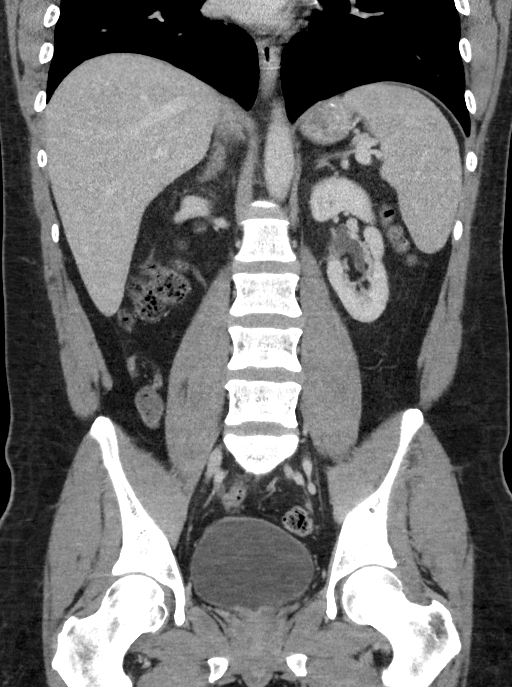

[15 of 46 positions shown; findings below may reference images not displayed]

FINDINGS: Lower chest: The lung bases are unremarkable.  Small hiatal hernia.

Hepatobiliary: Enhanced liver is unremarkable. No focal hepatic
mass. No calcified gallstones are noted within gallbladder.

Pancreas: Enhanced pancreas is unremarkable.

Spleen: The spleen measures 12.8 cm in length.  No splenic mass.

Adrenals/Urinary Tract: Kidneys are symmetrical in size and
enhancement. No adrenal gland mass. No hydronephrosis or
hydroureter.

Delayed renal images shows bilateral renal symmetrical excretion.
Bilateral visualized proximal ureter is unremarkable. Moderate
distended urinary bladder.

Stomach/Bowel: Oral contrast material was given to the patient. No
gastric outlet obstruction. No small bowel obstruction. The terminal
ileum is unremarkable. Moderate stool noted within right colon and
cecum. Normal appendix noted in axial image 57. No pericecal
inflammation. There is colonic stool for scratch there is some
colonic stool within transverse colon and descending colon. Mild
redundant sigmoid colon. No distal colonic obstruction. No colitis
or diverticulitis.

Vascular/Lymphatic: No retroperitoneal or mesenteric adenopathy.
Mild atherosclerotic calcifications of right common iliac artery.

Reproductive: Prostate gland and seminal vesicles are unremarkable.
The no inguinal adenopathy.

Other: No ascites or free abdominal air.

Musculoskeletal: Sagittal images of the spine shows no destructive
bony lesions. Mild degenerative changes thoracic spine.
IMPRESSION: 1. No evidence of acute inflammatory process within abdomen.
2. Small hiatal hernia.
3. No hydronephrosis or hydroureter.
4. Moderate stool noted within cecum and right colon. No pericecal
inflammation. Normal appendix.
5. No small bowel obstruction.
6. No ascites or free air. No retroperitoneal or mesenteric
adenopathy.
7. Moderate distended urinary bladder.
8. No inguinal adenopathy.

## 2018-06-07 NOTE — Progress Notes (Signed)
Patient: Peter Ray Male    DOB: 04/13/1971   47 y.o.   MRN: [id number] Visit Date: 06/07/2018  Today's Provider: [name], MD   Chief Complaint  Patient presents with  . Hypertension    One month follow up   Subjective:    Hypertension  This is a chronic (Pt comes in for a one month follow up.  He started lisinopril 10mg  a day at his last visit. ) problem. The problem is controlled. Associated symptoms include anxiety. Pertinent negatives include no blurred vision, chest pain, headaches, malaise/fatigue, neck pain, orthopnea, palpitations, peripheral edema, PND, shortness of breath or sweats. There are no associated agents to hypertension. The current treatment provides moderate improvement. There are no compliance problems.    BP Readings from Last 3 Encounters:  06/07/18 114/68  05/03/18 (!) 148/82  01/05/18 135/84    Allergies  Allergen Reactions  . Iodinated Diagnostic Agents     Contrast, 12 yrs ago hives, itching, difficulty breathing.   . Levofloxacin   . Sertraline Hives  . Shellfish Allergy   . Tree Extract     and grass  . Bupropion Rash    Also lips were a little swollen     Current Outpatient Medications:  .  amLODipine (NORVASC) 5 MG tablet, Take 1 tablet (5 mg total) by mouth daily., Disp: 90 tablet, Rfl: 3 .  fexofenadine (ALLEGRA) 60 MG tablet, Take 60 mg by mouth 2 (two) times daily., Disp: , Rfl:  .  fluticasone (FLONASE) 50 MCG/ACT nasal spray, Place into both nostrils daily as needed. , Disp: , Rfl:  .  lisinopril (PRINIVIL,ZESTRIL) 10 MG tablet, Take 1 tablet (10 mg total) by mouth daily., Disp: 30 tablet, Rfl: 3  Review of Systems  Constitutional: Negative.  Negative for malaise/fatigue.  Eyes: Negative for blurred vision.  Respiratory: Negative.  Negative for shortness of breath.   Cardiovascular: Negative.  Negative for chest pain, palpitations, orthopnea and PND.  Gastrointestinal: Negative.   Endocrine: Negative.     Musculoskeletal: Negative for neck pain.  Allergic/Immunologic: Negative.   Neurological: Negative for dizziness, light-headedness and headaches.  Hematological: Negative.   Psychiatric/Behavioral: Negative.     Social History   Tobacco Use  . Smoking status: Never Smoker  . Smokeless tobacco: Never Used  Substance Use Topics  . Alcohol use: Yes    Alcohol/week: 0.0 standard drinks    Comment: occasional    Objective:   BP 114/68 (BP Location: Right Arm, Patient Position: Sitting, Cuff Size: Large)   Pulse 88   Temp 98.7 F (37.1 C) (Oral)   Wt 241 lb (109.3 kg)   SpO2 95%   BMI 32.69 kg/m  Vitals:   06/07/18 1538  BP: 114/68  Pulse: 88  Temp: 98.7 F (37.1 C)  TempSrc: Oral  SpO2: 95%  Weight: 241 lb (109.3 kg)     Physical Exam  Constitutional: He is oriented to person, place, and time. He appears well-developed and well-nourished.  HENT:  Head: Normocephalic and atraumatic.  Right Ear: External ear normal.  Left Ear: External ear normal.  Nose: Nose normal.  Mouth/Throat: Oropharynx is clear and moist.  Eyes: Conjunctivae are normal. No scleral icterus.  Neck: No thyromegaly present.  Cardiovascular: Normal rate, regular rhythm and normal heart sounds.  Pulmonary/Chest: Effort normal and breath sounds normal.  Abdominal: Soft.  Musculoskeletal: He exhibits no edema.  Neurological: He is alert and oriented to person, place, and time.  Skin:  Skin is warm and dry.  Psychiatric: He has a normal mood and affect. His behavior is normal. Judgment and thought content normal.        Assessment & Plan:     1. Elevated blood pressure reading  - Comprehensive metabolic panel  2. Essential hypertension Better on lisinopril. - Comprehensive metabolic panel 3.Chronic Anxiety RTC end of year.      I have done the exam and reviewed the above chart and it is accurate to the best of my knowledge. Development worker, community has been used in this note in any air is in  the dictation or transcription are unintentional.  [name], MD  [place]

## 2018-06-08 LAB — COMPREHENSIVE METABOLIC PANEL
A/G RATIO: 1.8 (ref 1.2–2.2)
ALBUMIN: 4.6 g/dL (ref 3.5–5.5)
ALT: 19 IU/L (ref 0–44)
AST: 18 IU/L (ref 0–40)
Alkaline Phosphatase: 63 IU/L (ref 39–117)
BUN/Creatinine Ratio: 16 (ref 9–20)
BUN: 18 mg/dL (ref 6–24)
Bilirubin Total: 0.3 mg/dL (ref 0.0–1.2)
CALCIUM: 9.3 mg/dL (ref 8.7–10.2)
CO2: 25 mmol/L (ref 20–29)
CREATININE: 1.12 mg/dL (ref 0.76–1.27)
Chloride: 101 mmol/L (ref 96–106)
GFR, EST AFRICAN AMERICAN: 91 mL/min/{1.73_m2} (ref >59)
GFR, EST NON AFRICAN AMERICAN: 78 mL/min/{1.73_m2} (ref >59)
GLOBULIN, TOTAL: 2.6 g/dL (ref 1.5–4.5)
Glucose: 87 mg/dL (ref 65–99)
POTASSIUM: 4.6 mmol/L (ref 3.5–5.2)
SODIUM: 142 mmol/L (ref 134–144)
TOTAL PROTEIN: 7.2 g/dL (ref 6.0–8.5)

## 2018-06-12 ENCOUNTER — Telehealth: Payer: Self-pay

## 2018-06-12 NOTE — Telephone Encounter (Signed)
Pt advised.   Thanks,   -Willey Due  

## 2018-06-12 NOTE — Telephone Encounter (Signed)
-----   Message from Jerrol Banana., MD sent at 06/12/2018 10:15 AM EDT ----- Labs ok.

## 2018-09-04 ENCOUNTER — Other Ambulatory Visit: Payer: Self-pay | Admitting: Family Medicine

## 2018-09-04 DIAGNOSIS — I10 Essential (primary) hypertension: Secondary | ICD-10-CM

## 2018-09-24 ENCOUNTER — Ambulatory Visit: Payer: Managed Care, Other (non HMO) | Admitting: Family Medicine

## 2018-09-24 VITALS — BP 110/72 | HR 85 | Temp 98.3°F | Resp 16 | Wt 242.0 lb

## 2018-09-24 DIAGNOSIS — Z8547 Personal history of malignant neoplasm of testis: Secondary | ICD-10-CM

## 2018-09-24 DIAGNOSIS — I10 Essential (primary) hypertension: Secondary | ICD-10-CM | POA: Diagnosis not present

## 2018-09-24 MED ORDER — AMLODIPINE BESYLATE 5 MG PO TABS: 5.0000 mg | ORAL_TABLET | Freq: Every day | ORAL | 12 refills | Status: DC

## 2018-09-24 NOTE — Progress Notes (Signed)
Peter Ray  MRN: [id number] DOB: 08-23-1971  Subjective:  HPI   The patient is a 47 year old male who presents for follow up of his hypertension.  He was last seen on 06/07/18.  On that visit his pressure was improved on Lisinopril.  The patient does complain that his joints have been hurting since going on the Lisinopril.  He also needs to have his Amlodipine refilled. Patient is married, his wife is doing well, has 3 children are doing fine.  BP Readings from Last 3 Encounters:  09/24/18 110/72  06/07/18 114/68  05/03/18 (!) 148/82     Patient Active Problem List   Diagnosis Date Noted  . History of testicular cancer 07/18/2016  . Allergic rhinitis 08/13/2015  . Elevated blood pressure reading 08/13/2015  . Feeling stressed out 08/13/2015    Past Medical History:  Diagnosis Date  . Allergy   . Cancer ([place])    testicular ca. 12 yrs. radiation and surgery    Social History   Socioeconomic History  . Marital status: Married    Spouse name: Not on file  . Number of children: Not on file  . Years of education: Not on file  . Highest education level: Not on file  Occupational History  . Not on file  Social Needs  . Financial resource strain: Not on file  . Food insecurity:    Worry: Not on file    Inability: Not on file  . Transportation needs:    Medical: Not on file    Non-medical: Not on file  Tobacco Use  . Smoking status: Never Smoker  . Smokeless tobacco: Never Used  Substance and Sexual Activity  . Alcohol use: Yes    Alcohol/week: 0.0 standard drinks    Comment: occasional   . Drug use: No  . Sexual activity: Not on file  Lifestyle  . Physical activity:    Days per week: Not on file    Minutes per session: Not on file  . Stress: Not on file  Relationships  . Social connections:    Talks on phone: Not on file    Gets together: Not on file    Attends religious service: Not on file    Active member of club or organization: Not on file   Attends meetings of clubs or organizations: Not on file    Relationship status: Not on file  . Intimate partner violence:    Fear of current or ex partner: Not on file    Emotionally abused: Not on file    Physically abused: Not on file    Forced sexual activity: Not on file  Other Topics Concern  . Not on file  Social History Narrative  . Not on file    Outpatient Encounter Medications as of 09/24/2018  Medication Sig  . amLODipine (NORVASC) 5 MG tablet Take 1 tablet (5 mg total) by mouth daily.  . fexofenadine (ALLEGRA) 60 MG tablet Take 60 mg by mouth 2 (two) times daily.  . fluticasone (FLONASE) 50 MCG/ACT nasal spray Place into both nostrils daily as needed.   Marland Kitchen lisinopril (PRINIVIL,ZESTRIL) 10 MG tablet TAKE 1 TABLET(10 MG) BY MOUTH DAILY   No facility-administered encounter medications on file as of 09/24/2018.     Allergies  Allergen Reactions  . Iodinated Diagnostic Agents     Contrast, 12 yrs ago hives, itching, difficulty breathing.   . Levofloxacin   . Sertraline Hives  . Shellfish Allergy   . Tree Extract  and grass  . Bupropion Rash    Also lips were a little swollen    Review of Systems  Constitutional: Negative.   HENT: Negative.   Eyes: Negative.   Respiratory: Negative for cough, shortness of breath and wheezing.   Cardiovascular: Negative for chest pain, palpitations, orthopnea, claudication and leg swelling.  Gastrointestinal: Negative.   Skin: Negative.   Endo/Heme/Allergies: Negative.   Psychiatric/Behavioral: Negative.     Objective:  BP 110/72 (BP Location: Right Arm, Patient Position: Sitting, Cuff Size: Normal)   Pulse 85   Temp 98.3 F (36.8 C) (Oral)   Resp 16   Wt 242 lb (109.8 kg)   SpO2 98%   BMI 32.82 kg/m   Physical Exam  Constitutional: He is oriented to person, place, and time and well-developed, well-nourished, and in no distress.  HENT:  Head: Normocephalic and atraumatic.  Right Ear: External ear normal.  Left  Ear: External ear normal.  Nose: Nose normal.  Eyes: Pupils are equal, round, and reactive to light. Conjunctivae are normal. No scleral icterus.  Neck: Normal range of motion. No thyromegaly present.  Cardiovascular: Normal rate, regular rhythm and normal heart sounds.  Pulmonary/Chest: Effort normal and breath sounds normal.  Abdominal: Soft.  Neurological: He is alert and oriented to person, place, and time. GCS score is 15.  Skin: Skin is warm and dry.  Psychiatric: Mood, memory, affect and judgment normal.    Assessment and Plan :  1. Essential hypertension Blood pressure good just on Norvasc.  Stay off lisinopril for now although I do not think that was the source of his arthralgia - amLODipine (NORVASC) 5 MG tablet; Take 1 tablet (5 mg total) by mouth daily.  Dispense: 30 tablet; Refill: 12  2. History of testicular cancer  Return to clinic 6 months for CPE.  I have done the exam and reviewed the chart and it is accurate to the best of my knowledge. Development worker, community has been used and  any errors in dictation or transcription are unintentional. Miguel Aschoff M.D. [facility]

## 2018-09-29 DIAGNOSIS — I10 Essential (primary) hypertension: Secondary | ICD-10-CM | POA: Insufficient documentation

## 2018-10-11 ENCOUNTER — Telehealth: Payer: Self-pay | Admitting: Family Medicine

## 2018-10-11 NOTE — Telephone Encounter (Signed)
Last said to go to amlodipine 5.  He is already on this.  Will add HCTZ 25 mg instead of going up on the amlodipine

## 2018-10-11 NOTE — Telephone Encounter (Signed)
Pt calling regarding lisinopril (PRINIVIL,ZESTRIL) 10 MG tablet  He stopped on 09-25-18 because it's causing joints to hurt.  Wanting to discuss next kind of Rx.  Please advise.  Thanks, [employer]

## 2018-10-11 NOTE — Telephone Encounter (Signed)
If the leg pain is gone then I would stay off lisinopril and go to amlodipine 5 mg daily.

## 2018-10-12 MED ORDER — HYDROCHLOROTHIAZIDE 25 MG PO TABS: 25.0000 mg | ORAL_TABLET | Freq: Every day | ORAL | 11 refills | Status: DC

## 2018-10-12 NOTE — Telephone Encounter (Signed)
Patient advised, will send in prescription to [facility] in [place] for 30 days. Advised patient to give [place] a call back if he is unable to tolerate HCTZ. KW

## 2018-11-07 ENCOUNTER — Encounter: Payer: Self-pay | Admitting: Family Medicine

## 2018-11-07 NOTE — Telephone Encounter (Signed)
Change HCTZ to telmesartan 40mg  daily

## 2018-11-09 ENCOUNTER — Encounter: Payer: Self-pay | Admitting: Family Medicine

## 2018-11-12 ENCOUNTER — Other Ambulatory Visit: Payer: Self-pay

## 2018-11-12 MED ORDER — TELMISARTAN 80 MG PO TABS: 40.0000 mg | ORAL_TABLET | Freq: Every day | ORAL | 12 refills | Status: DC

## 2018-12-26 ENCOUNTER — Encounter: Payer: Managed Care, Other (non HMO) | Admitting: Family Medicine

## 2019-01-10 ENCOUNTER — Encounter: Payer: Self-pay | Admitting: Family Medicine

## 2019-04-01 ENCOUNTER — Other Ambulatory Visit: Payer: Self-pay

## 2019-04-01 ENCOUNTER — Ambulatory Visit: Payer: Managed Care, Other (non HMO) | Admitting: Family Medicine

## 2019-04-01 ENCOUNTER — Encounter: Payer: Self-pay | Admitting: Family Medicine

## 2019-04-01 VITALS — BP 110/68 | HR 80 | Temp 98.7°F | Resp 16 | Wt 251.0 lb

## 2019-04-01 DIAGNOSIS — Z8547 Personal history of malignant neoplasm of testis: Secondary | ICD-10-CM

## 2019-04-01 DIAGNOSIS — I10 Essential (primary) hypertension: Secondary | ICD-10-CM | POA: Diagnosis not present

## 2019-04-01 DIAGNOSIS — F439 Reaction to severe stress, unspecified: Secondary | ICD-10-CM | POA: Diagnosis not present

## 2019-04-01 DIAGNOSIS — B029 Zoster without complications: Secondary | ICD-10-CM

## 2019-04-01 MED ORDER — VALACYCLOVIR HCL 1 G PO TABS: 1000.0000 mg | ORAL_TABLET | Freq: Three times a day (TID) | ORAL | 0 refills | Status: DC

## 2019-04-01 NOTE — Progress Notes (Signed)
Patient: Peter Ray Male    DOB: 21-Jan-1971   48 y.o.   MRN: [id number] Visit Date: 04/01/2019  Today's Provider: [name], MD   Chief Complaint  Patient presents with  . Rash   Subjective:    Rash This is a new problem. The current episode started in the past 7 days (about 3 days). The problem has been gradually worsening since onset. The affected locations include the right shoulder. He was exposed to nothing. Pertinent negatives include no cough, fatigue or shortness of breath. Past treatments include topical steroids.  The rash is mildly painful and feels like shingles he has had in past.Otherwise he feels well.  Allergies  Allergen Reactions  . Iodinated Diagnostic Agents     Contrast, 12 yrs ago hives, itching, difficulty breathing.   . Levofloxacin   . Sertraline Hives  . Shellfish Allergy   . Tree Extract     and grass  . Bupropion Rash    Also lips were a little swollen     Current Outpatient Medications:  .  amLODipine (NORVASC) 5 MG tablet, Take 1 tablet (5 mg total) by mouth daily., Disp: 30 tablet, Rfl: 12 .  fexofenadine (ALLEGRA) 60 MG tablet, Take 60 mg by mouth 2 (two) times daily., Disp: , Rfl:  .  fluticasone (FLONASE) 50 MCG/ACT nasal spray, Place into both nostrils daily as needed. , Disp: , Rfl:  .  telmisartan (MICARDIS) 80 MG tablet, Take 0.5 tablets (40 mg total) by mouth daily., Disp: 30 tablet, Rfl: 12  Review of Systems  Constitutional: Negative for activity change, chills, diaphoresis and fatigue.  Respiratory: Negative for cough and shortness of breath.   Cardiovascular: Negative for chest pain, palpitations and leg swelling.  Skin: Positive for rash.  Neurological: Negative for dizziness.    Social History   Tobacco Use  . Smoking status: Never Smoker  . Smokeless tobacco: Never Used  Substance Use Topics  . Alcohol use: Yes    Alcohol/week: 0.0 standard drinks    Comment: occasional       Objective:   BP  110/68 (BP Location: Left Arm, Patient Position: Sitting, Cuff Size: Normal)   Pulse 80   Temp 98.7 F (37.1 C)   Resp 16   Wt 251 lb (113.9 kg)   SpO2 98%   BMI 34.04 kg/m  Vitals:   04/01/19 0930  BP: 110/68  Pulse: 80  Resp: 16  Temp: 98.7 F (37.1 C)  SpO2: 98%  Weight: 251 lb (113.9 kg)     Physical Exam Vitals signs reviewed.  Constitutional:      Appearance: He is well-developed.  HENT:     Head: Normocephalic and atraumatic.     Right Ear: External ear normal.     Left Ear: External ear normal.     Nose: Nose normal.  Eyes:     General: No scleral icterus.    Conjunctiva/sclera: Conjunctivae normal.  Neck:     Thyroid: No thyromegaly.  Cardiovascular:     Rate and Rhythm: Normal rate and regular rhythm.     Heart sounds: Normal heart sounds.  Pulmonary:     Effort: Pulmonary effort is normal.     Breath sounds: Normal breath sounds.  Abdominal:     Palpations: Abdomen is soft.  Skin:    General: Skin is warm and dry.     Findings: Rash present.     Comments: Mild vesicular rash just proximal to  right shoulder over trapezius.  Neurological:     Mental Status: He is alert and oriented to person, place, and time. Mental status is at baseline.  Psychiatric:        Behavior: Behavior normal.        Thought Content: Thought content normal.        Judgment: Judgment normal.         Assessment & Plan    1. Herpes zoster without complication On Right shoulder. Treat with Valtrex as below. Patient is to call if not improving.   - valACYclovir (VALTREX) 1000 MG tablet; Take 1 tablet (1,000 mg total) by mouth 3 (three) times daily.  Dispense: 21 tablet; Refill: 0    I, Rachelle L. Presley, CMA, am acting as a Education administrator for [employer]. Rosanna Randy, [place]   [name], MD  [facility]

## 2019-05-15 ENCOUNTER — Other Ambulatory Visit: Payer: Self-pay

## 2019-05-15 ENCOUNTER — Encounter: Payer: Self-pay | Admitting: Family Medicine

## 2019-05-15 ENCOUNTER — Ambulatory Visit: Payer: Managed Care, Other (non HMO) | Admitting: Family Medicine

## 2019-05-15 VITALS — BP 123/80 | HR 81 | Temp 98.4°F | Resp 16 | Wt 258.8 lb

## 2019-05-15 DIAGNOSIS — M25512 Pain in left shoulder: Secondary | ICD-10-CM

## 2019-05-15 MED ORDER — PREDNISONE 10 MG (21) PO TBPK: ORAL_TABLET | ORAL | 0 refills | Status: DC

## 2019-05-15 NOTE — Progress Notes (Signed)
Patient: Peter Ray Male    DOB: 1970/10/17   48 y.o.   MRN: [id number] Visit Date: 05/15/2019  Today's Provider: [name], MD   Chief Complaint  Patient presents with  . Arm Pain   Subjective:     Arm Pain  The incident occurred more than 1 week ago. The injury mechanism is unknown. The pain is present in the left shoulder. The quality of the pain is described as shooting. Radiates to: left elbow. The pain is moderate. The pain has been intermittent since the incident. Associated symptoms include muscle weakness. Pertinent negatives include no chest pain, numbness or tingling. He has tried acetaminophen and NSAIDs for the symptoms. The treatment provided moderate relief.    Allergies  Allergen Reactions  . Iodinated Diagnostic Agents     Contrast, 12 yrs ago hives, itching, difficulty breathing.   . Levofloxacin   . Sertraline Hives  . Shellfish Allergy   . Tree Extract     and grass  . Bupropion Rash    Also lips were a little swollen     Current Outpatient Medications:  .  fexofenadine (ALLEGRA) 60 MG tablet, Take 60 mg by mouth 2 (two) times daily., Disp: , Rfl:  .  fluticasone (FLONASE) 50 MCG/ACT nasal spray, Place into both nostrils daily as needed. , Disp: , Rfl:  .  telmisartan (MICARDIS) 80 MG tablet, Take 0.5 tablets (40 mg total) by mouth daily., Disp: 30 tablet, Rfl: 12 .  amLODipine (NORVASC) 5 MG tablet, Take 1 tablet (5 mg total) by mouth daily. (Patient not taking: Reported on 05/15/2019), Disp: 30 tablet, Rfl: 12 .  valACYclovir (VALTREX) 1000 MG tablet, Take 1 tablet (1,000 mg total) by mouth 3 (three) times daily. (Patient not taking: Reported on 05/15/2019), Disp: 21 tablet, Rfl: 0  Review of Systems  Constitutional: Negative for activity change, chills, diaphoresis and fatigue.  Respiratory: Negative for cough and shortness of breath.   Cardiovascular: Negative for chest pain, palpitations and leg swelling.  Musculoskeletal: Positive for  arthralgias and myalgias.  Neurological: Negative for dizziness, tingling and numbness.  All other systems reviewed and are negative.   Social History   Tobacco Use  . Smoking status: Never Smoker  . Smokeless tobacco: Never Used  Substance Use Topics  . Alcohol use: Yes    Alcohol/week: 0.0 standard drinks    Comment: occasional       Objective:   BP 123/80   Pulse 81   Temp 98.4 F (36.9 C) (Oral)   Resp 16   Wt 258 lb 12.8 oz (117.4 kg)   BMI 35.10 kg/m  Vitals:   05/15/19 1632  BP: 123/80  Pulse: 81  Resp: 16  Temp: 98.4 F (36.9 C)  TempSrc: Oral  Weight: 258 lb 12.8 oz (117.4 kg)     Physical Exam Vitals signs reviewed.  Constitutional:      Appearance: He is well-developed.  HENT:     Head: Normocephalic and atraumatic.     Right Ear: External ear normal.     Left Ear: External ear normal.     Nose: Nose normal.  Eyes:     General: No scleral icterus.    Conjunctiva/sclera: Conjunctivae normal.  Neck:     Thyroid: No thyromegaly.  Cardiovascular:     Rate and Rhythm: Normal rate and regular rhythm.     Heart sounds: Normal heart sounds.  Pulmonary:     Effort: Pulmonary effort is  normal.     Breath sounds: Normal breath sounds.  Abdominal:     Palpations: Abdomen is soft.  Musculoskeletal:        General: Tenderness present.     Comments: Tender along left lateral deltoid. Pain with abduction of left shoulder.  Skin:    General: Skin is warm and dry.  Neurological:     Mental Status: He is alert and oriented to person, place, and time.  Psychiatric:        Behavior: Behavior normal.        Thought Content: Thought content normal.        Judgment: Judgment normal.      No results found for any visits on 05/15/19.     Assessment & Plan    1. Acute pain of left shoulder New Problem. Treat with prednisone as below. Patient is to call if not improving. May need ortho referral. - predniSONE (STERAPRED UNI-PAK 21 TAB) 10 MG (21) TBPK  tablet; Taper as directed.  Dispense: 21 tablet; Refill: 0    [name], MD  [facility] [employer] as a scribe for [name], MD.,have documented all relevant documentation on the behalf of [name], MD,as directed by  [name], MD while in the presence of [name], MD.

## 2019-05-22 ENCOUNTER — Ambulatory Visit (INDEPENDENT_AMBULATORY_CARE_PROVIDER_SITE_OTHER): Payer: Managed Care, Other (non HMO) | Admitting: Family Medicine

## 2019-05-22 ENCOUNTER — Other Ambulatory Visit: Payer: Self-pay

## 2019-05-22 ENCOUNTER — Encounter: Payer: Self-pay | Admitting: Family Medicine

## 2019-05-22 VITALS — BP 128/74 | HR 76 | Temp 98.6°F | Resp 16 | Ht 72.0 in | Wt 257.0 lb

## 2019-05-22 DIAGNOSIS — Z125 Encounter for screening for malignant neoplasm of prostate: Secondary | ICD-10-CM

## 2019-05-22 DIAGNOSIS — I10 Essential (primary) hypertension: Secondary | ICD-10-CM | POA: Diagnosis not present

## 2019-05-22 DIAGNOSIS — Z0000 Encounter for general adult medical examination without abnormal findings: Secondary | ICD-10-CM | POA: Diagnosis not present

## 2019-05-22 NOTE — Progress Notes (Signed)
Patient: Peter Ray, Male    DOB: [birth year], 48 y.o.   MRN: [id number] Visit Date: 05/22/2019  Today's Provider: [name], MD   Chief Complaint  Patient presents with  . Annual Exam   Subjective:     Annual physical exam Peter Ray is a 48 y.o. male who presents today for health maintenance and complete physical. He feels well. He reports exercising not regularly, but he does stay active. He reports he is sleeping well. He is followed by dermatology --Dr Kellie Moor. Tdap- 06/07/2011.    Patient also mentions that he has just finished the prednisone for having left shoulder pain. He was seen in the office on 05/15/2019. He reports that his pain is a little better, but still not back to 100%.   Review of Systems  Constitutional: Negative.   HENT: Negative.   Eyes: Negative.   Respiratory: Negative.   Cardiovascular: Negative.   Gastrointestinal: Negative.   Endocrine: Negative.   Genitourinary: Negative.   Musculoskeletal: Positive for arthralgias and myalgias.  Skin: Negative.   Allergic/Immunologic: Negative.   Neurological: Negative.   Hematological: Negative.   Psychiatric/Behavioral: Negative.     Social History      He  reports that he has never smoked. He has never used smokeless tobacco. He reports current alcohol use. He reports that he does not use drugs.       Social History   Socioeconomic History  . Marital status: Married    Spouse name: Not on file  . Number of children: Not on file  . Years of education: Not on file  . Highest education level: Not on file  Occupational History  . Not on file  Social Needs  . Financial resource strain: Not on file  . Food insecurity    Worry: Not on file    Inability: Not on file  . Transportation needs    Medical: Not on file    Non-medical: Not on file  Tobacco Use  . Smoking status: Never Smoker  . Smokeless tobacco: Never Used  Substance and Sexual Activity  . Alcohol use: Yes   Alcohol/week: 0.0 standard drinks    Comment: occasional   . Drug use: No  . Sexual activity: Not on file  Lifestyle  . Physical activity    Days per week: Not on file    Minutes per session: Not on file  . Stress: Not on file  Relationships  . Social Herbalist on phone: Not on file    Gets together: Not on file    Attends religious service: Not on file    Active member of club or organization: Not on file    Attends meetings of clubs or organizations: Not on file    Relationship status: Not on file  Other Topics Concern  . Not on file  Social History Narrative  . Not on file    Past Medical History:  Diagnosis Date  . Allergy   . Cancer ([place])    testicular ca. 12 yrs. radiation and surgery     Patient Active Problem List   Diagnosis Date Noted  . HTN (hypertension) 09/29/2018  . History of testicular cancer 07/18/2016  . Allergic rhinitis 08/13/2015  . Elevated blood pressure reading 08/13/2015  . Feeling stressed out 08/13/2015    Past Surgical History:  Procedure Laterality Date  . TESTICLE REMOVAL Right 2001   secondary to cancer  . TONSILLECTOMY  Family History        Family Status  Relation Name Status  . Mother  Alive  . Father  Deceased  . Daughter  Alive  . Son  Alive  . Son  Alive        His family history is not on file.      Allergies  Allergen Reactions  . Iodinated Diagnostic Agents     Contrast, 12 yrs ago hives, itching, difficulty breathing.   . Levofloxacin   . Sertraline Hives  . Shellfish Allergy   . Tree Extract     and grass  . Bupropion Rash    Also lips were a little swollen     Current Outpatient Medications:  .  fexofenadine (ALLEGRA) 60 MG tablet, Take 60 mg by mouth 2 (two) times daily., Disp: , Rfl:  .  fluticasone (FLONASE) 50 MCG/ACT nasal spray, Place into both nostrils daily as needed. , Disp: , Rfl:  .  telmisartan (MICARDIS) 80 MG tablet, Take 0.5 tablets (40 mg total) by mouth daily., Disp:  30 tablet, Rfl: 12 .  amLODipine (NORVASC) 5 MG tablet, Take 1 tablet (5 mg total) by mouth daily. (Patient not taking: Reported on 05/15/2019), Disp: 30 tablet, Rfl: 12 .  predniSONE (STERAPRED UNI-PAK 21 TAB) 10 MG (21) TBPK tablet, Taper as directed., Disp: 21 tablet, Rfl: 0 .  valACYclovir (VALTREX) 1000 MG tablet, Take 1 tablet (1,000 mg total) by mouth 3 (three) times daily. (Patient not taking: Reported on 05/15/2019), Disp: 21 tablet, Rfl: 0   Patient Care Team: Jerrol Banana., MD as PCP - General (Family Medicine)    Objective:    Vitals: BP 128/74   Pulse 76   Temp 98.6 F (37 C)   Resp 16   Ht 6' (1.829 m)   Wt 257 lb (116.6 kg)   SpO2 99%   BMI 34.86 kg/m    Vitals:   05/22/19 0910  BP: 128/74  Pulse: 76  Resp: 16  Temp: 98.6 F (37 C)  SpO2: 99%  Weight: 257 lb (116.6 kg)  Height: 6' (1.829 m)     Physical Exam Vitals signs reviewed.  Constitutional:      Appearance: He is well-developed. He is obese.     Comments: Muscular,mildly obese.  HENT:     Head: Normocephalic and atraumatic.     Right Ear: External ear normal.     Left Ear: External ear normal.     Nose: Nose normal.  Eyes:     General: No scleral icterus.    Conjunctiva/sclera: Conjunctivae normal.  Neck:     Thyroid: No thyromegaly.  Cardiovascular:     Rate and Rhythm: Normal rate and regular rhythm.     Heart sounds: Normal heart sounds.  Pulmonary:     Effort: Pulmonary effort is normal.     Breath sounds: Normal breath sounds.  Abdominal:     Palpations: Abdomen is soft.  Genitourinary:    Penis: Normal.      Scrotum/Testes: Normal.  Lymphadenopathy:     Cervical: No cervical adenopathy.  Skin:    General: Skin is warm and dry.  Neurological:     General: No focal deficit present.     Mental Status: He is alert and oriented to person, place, and time.  Psychiatric:        Mood and Affect: Mood normal.        Behavior: Behavior normal.  Thought Content: Thought  content normal.        Judgment: Judgment normal.      Depression Screen PHQ 2/9 Scores 05/22/2019 11/08/2017 08/13/2015  PHQ - 2 Score 1 0 0  PHQ- 9 Score 4 - 2       Assessment & Plan:     Routine Health Maintenance and Physical Exam  Exercise Activities and Dietary recommendations Goals   None     Immunization History  Administered Date(s) Administered  . DTaP 08/07/1974  . Hepatitis B 06/07/2011, 07/08/2011  . IPV 08/07/1974, 06/02/1981  . MMR 11/22/1972, 07/08/2011  . Td 03/31/1978, 06/02/1981, 08/18/1990  . Tdap 06/07/2011  . Varicella 06/10/2011, 07/08/2011    Health Maintenance  Topic Date Due  . HIV Screening  08/02/1986  . INFLUENZA VACCINE  05/11/2019  . TETANUS/TDAP  06/06/2021     Discussed health benefits of physical activity, and encouraged him to engage in regular exercise appropriate for his age and condition.  1. Annual physical exam  - CBC with Differential/Platelet - Comprehensive metabolic panel - Lipid panel - TSH  2. Essential hypertension Controlled. - CBC with Differential/Platelet - Comprehensive metabolic panel  3. Prostate cancer screening  - PSA      I have done the exam and reviewed the above chart and it is accurate to the best of my knowledge. Development worker, community has been used in this note in any air is in the dictation or transcription are unintentional.  [name], MD  [facility]

## 2019-05-23 ENCOUNTER — Telehealth: Payer: Self-pay

## 2019-05-23 LAB — LIPID PANEL
Chol/HDL Ratio: 4.7 ratio (ref 0.0–5.0)
Cholesterol, Total: 214 mg/dL — ABNORMAL HIGH (ref 100–199)
HDL: 46 mg/dL (ref >39)
LDL Calculated: 140 mg/dL — ABNORMAL HIGH (ref 0–99)
Triglycerides: 142 mg/dL (ref 0–149)
VLDL Cholesterol Cal: 28 mg/dL (ref 5–40)

## 2019-05-23 LAB — COMPREHENSIVE METABOLIC PANEL
ALT: 21 IU/L (ref 0–44)
AST: 13 IU/L (ref 0–40)
Albumin/Globulin Ratio: 1.6 (ref 1.2–2.2)
Albumin: 4.1 g/dL (ref 4.0–5.0)
Alkaline Phosphatase: 59 IU/L (ref 39–117)
BUN/Creatinine Ratio: 27 — ABNORMAL HIGH (ref 9–20)
BUN: 24 mg/dL (ref 6–24)
Bilirubin Total: 0.5 mg/dL (ref 0.0–1.2)
CO2: 23 mmol/L (ref 20–29)
Calcium: 9.2 mg/dL (ref 8.7–10.2)
Chloride: 103 mmol/L (ref 96–106)
Creatinine, Ser: 0.89 mg/dL (ref 0.76–1.27)
GFR calc Af Amer: 118 mL/min/{1.73_m2} (ref >59)
GFR calc non Af Amer: 102 mL/min/{1.73_m2} (ref >59)
Globulin, Total: 2.5 g/dL (ref 1.5–4.5)
Glucose: 80 mg/dL (ref 65–99)
Potassium: 4.3 mmol/L (ref 3.5–5.2)
Sodium: 140 mmol/L (ref 134–144)
Total Protein: 6.6 g/dL (ref 6.0–8.5)

## 2019-05-23 LAB — CBC WITH DIFFERENTIAL/PLATELET
Basophils Absolute: 0.1 10*3/uL (ref 0.0–0.2)
Basos: 1 %
EOS (ABSOLUTE): 0.3 10*3/uL (ref 0.0–0.4)
Eos: 3 %
Hematocrit: 43.6 % (ref 37.5–51.0)
Hemoglobin: 14.7 g/dL (ref 13.0–17.7)
Immature Grans (Abs): 0.1 10*3/uL (ref 0.0–0.1)
Immature Granulocytes: 1 %
Lymphocytes Absolute: 2.6 10*3/uL (ref 0.7–3.1)
Lymphs: 30 %
MCH: 30.9 pg (ref 26.6–33.0)
MCHC: 33.7 g/dL (ref 31.5–35.7)
MCV: 92 fL (ref 79–97)
Monocytes Absolute: 0.6 10*3/uL (ref 0.1–0.9)
Monocytes: 7 %
Neutrophils Absolute: 5.2 10*3/uL (ref 1.4–7.0)
Neutrophils: 58 %
Platelets: 241 10*3/uL (ref 150–450)
RBC: 4.75 x10E6/uL (ref 4.14–5.80)
RDW: 12.7 % (ref 11.6–15.4)
WBC: 8.8 10*3/uL (ref 3.4–10.8)

## 2019-05-23 LAB — TSH: TSH: 1.64 u[IU]/mL (ref 0.450–4.500)

## 2019-05-23 LAB — PSA: Prostate Specific Ag, Serum: 0.4 ng/mL (ref 0.0–4.0)

## 2019-05-23 NOTE — Telephone Encounter (Signed)
-----   Message from Jerrol Banana., MD sent at 05/23/2019 11:09 AM EDT ----- Labs WNL.

## 2019-05-23 NOTE — Telephone Encounter (Signed)
[email or web address]

## 2019-05-28 NOTE — Telephone Encounter (Signed)
Patient notified of lab results

## 2019-11-22 NOTE — Progress Notes (Signed)
Patient: Peter Ray Male    DOB: June 13, 1971   49 y.o.   MRN: [id number] Visit Date: 11/25/2019  Today's Provider: [name], MD   Chief Complaint  Patient presents with  . Follow-up  . Hypertension   Subjective:     HPI   Essential Hypertension From 05/22/2019-labs checked showing-within normal limits.  Acute pain of left shoulder From 05/15/2019-Treatd with prednisone taper. Patient is to call if not improving. May need ortho referral. Pain is now becoming chronic.  Any abduction or rotation of the left shoulder causes some pain.  No pain in the shoulder/arm with ambulating. Patient states he is still having pain in left shoulder and arm.  No known trauma.   Allergies  Allergen Reactions  . Iodinated Diagnostic Agents     Contrast, 12 yrs ago hives, itching, difficulty breathing.   . Levofloxacin   . Sertraline Hives  . Shellfish Allergy   . Tree Extract     and grass  . Bupropion Rash    Also lips were a little swollen     Current Outpatient Medications:  .  fexofenadine (ALLEGRA) 60 MG tablet, Take 60 mg by mouth 2 (two) times daily., Disp: , Rfl:  .  fluticasone (FLONASE) 50 MCG/ACT nasal spray, Place into both nostrils daily as needed. , Disp: , Rfl:  .  telmisartan (MICARDIS) 80 MG tablet, Take 0.5 tablets (40 mg total) by mouth daily., Disp: 30 tablet, Rfl: 12 .  amLODipine (NORVASC) 5 MG tablet, Take 1 tablet (5 mg total) by mouth daily. (Patient not taking: Reported on 05/15/2019), Disp: 30 tablet, Rfl: 12 .  predniSONE (STERAPRED UNI-PAK 21 TAB) 10 MG (21) TBPK tablet, Taper as directed. (Patient not taking: Reported on 11/25/2019), Disp: 21 tablet, Rfl: 0 .  valACYclovir (VALTREX) 1000 MG tablet, Take 1 tablet (1,000 mg total) by mouth 3 (three) times daily. (Patient not taking: Reported on 05/15/2019), Disp: 21 tablet, Rfl: 0  Review of Systems  Constitutional: Negative for appetite change, chills and fever.  Respiratory: Negative for chest  tightness, shortness of breath and wheezing.   Cardiovascular: Negative for chest pain and palpitations.  Gastrointestinal: Negative for abdominal pain, nausea and vomiting.  Endocrine: Negative.   Musculoskeletal: Positive for arthralgias.  Allergic/Immunologic: Negative.   Neurological: Negative.   Hematological: Negative.   Psychiatric/Behavioral: Negative.     Social History   Tobacco Use  . Smoking status: Never Smoker  . Smokeless tobacco: Never Used  Substance Use Topics  . Alcohol use: Yes    Alcohol/week: 0.0 standard drinks    Comment: occasional       Objective:   BP 108/72 (BP Location: Right Arm, Patient Position: Sitting, Cuff Size: Large)   Pulse 77   Temp (!) 96.8 F (36 C) (Other (Comment))   Resp 16   Ht 6' (1.829 m)   Wt 247 lb (112 kg)   SpO2 96%   BMI 33.50 kg/m  Vitals:   11/25/19 1511  BP: 108/72  Pulse: 77  Resp: 16  Temp: (!) 96.8 F (36 C)  TempSrc: Other (Comment)  SpO2: 96%  Weight: 247 lb (112 kg)  Height: 6' (1.829 m)  Body mass index is 33.5 kg/m.   Physical Exam Vitals reviewed.  Constitutional:      Appearance: He is well-developed.  HENT:     Head: Normocephalic and atraumatic.     Right Ear: External ear normal.     Left Ear: External  ear normal.     Nose: Nose normal.  Eyes:     General: No scleral icterus.    Conjunctiva/sclera: Conjunctivae normal.  Neck:     Thyroid: No thyromegaly.  Cardiovascular:     Rate and Rhythm: Normal rate and regular rhythm.     Heart sounds: Normal heart sounds.  Pulmonary:     Effort: Pulmonary effort is normal.     Breath sounds: Normal breath sounds.  Abdominal:     Palpations: Abdomen is soft.  Musculoskeletal:        General: Tenderness present.     Comments:  Pain with abduction of left shoulder.  Especially when he gets to 90 degrees  Skin:    General: Skin is warm and dry.  Neurological:     Mental Status: He is alert and oriented to person, place, and time.    Psychiatric:        Behavior: Behavior normal.        Thought Content: Thought content normal.        Judgment: Judgment normal.      No results found for any visits on 11/25/19.     Assessment & Plan    1. Arthropathy of left shoulder Taking Aleve twice a day until he sees orthopedics. - AMB referral to orthopedics  2. Essential hypertension Much improved today.  3. History of testicular cancer Remote.    I,Harun Brumley,acting as a scribe for [name], MD.,have documented all relevant documentation on the behalf of [name], MD,as directed by  [name], MD while in the presence of [name], MD.      [name], MD  [facility]

## 2019-11-25 ENCOUNTER — Encounter: Payer: Self-pay | Admitting: Family Medicine

## 2019-11-25 ENCOUNTER — Other Ambulatory Visit: Payer: Self-pay

## 2019-11-25 ENCOUNTER — Ambulatory Visit: Payer: Managed Care, Other (non HMO) | Admitting: Family Medicine

## 2019-11-25 VITALS — BP 108/72 | HR 77 | Temp 96.8°F | Resp 16 | Ht 72.0 in | Wt 247.0 lb

## 2019-11-25 DIAGNOSIS — Z8547 Personal history of malignant neoplasm of testis: Secondary | ICD-10-CM

## 2019-11-25 DIAGNOSIS — M19012 Primary osteoarthritis, left shoulder: Secondary | ICD-10-CM

## 2019-11-25 DIAGNOSIS — I10 Essential (primary) hypertension: Secondary | ICD-10-CM

## 2019-12-07 ENCOUNTER — Other Ambulatory Visit: Payer: Self-pay | Admitting: Family Medicine

## 2020-02-28 NOTE — Progress Notes (Signed)
Trena Platt Cummings,acting as a scribe for [name], MD.,have documented all relevant documentation on the behalf of [name], MD,as directed by  [name], MD while in the presence of [name], MD.  Established patient visit   Patient: Peter Ray   DOB: [birth year]   49 y.o. Male  MRN: [id number] Visit Date: 03/03/2020  Today's healthcare provider: [name], MD   Chief Complaint  Patient presents with  . Flank Pain   Subjective    Flank Pain This is a new problem. The current episode started 1 to 4 weeks ago. The problem occurs intermittently. The problem has been waxing and waning since onset. The quality of the pain is described as aching. The pain does not radiate. The pain is worse during the day. The symptoms are aggravated by sitting. Associated symptoms include pelvic pain. Pertinent negatives include no abdominal pain, chest pain or fever. He has tried NSAIDs (cranberry juice) for the symptoms. The treatment provided mild relief.   The pain started in his left flank and seem to go around to his left groin.  It is better now.  There appears to be of mild pressure in this area.  No dysuria hematuria GI or other GU symptoms.      Medications: Outpatient Medications Prior to Visit  Medication Sig  . fexofenadine (ALLEGRA) 60 MG tablet Take 60 mg by mouth 2 (two) times daily.  . fluticasone (FLONASE) 50 MCG/ACT nasal spray Place into both nostrils daily as needed.   . meloxicam (MOBIC) 15 MG tablet Take 15 mg by mouth daily.  Marland Kitchen telmisartan (MICARDIS) 80 MG tablet TAKE 1/2 TABLET(40 MG) BY MOUTH DAILY  . amLODipine (NORVASC) 5 MG tablet Take 1 tablet (5 mg total) by mouth daily. (Patient not taking: Reported on 05/15/2019)  . valACYclovir (VALTREX) 1000 MG tablet Take 1 tablet (1,000 mg total) by mouth 3 (three) times daily. (Patient not taking: Reported on 05/15/2019)  . [DISCONTINUED] predniSONE (STERAPRED UNI-PAK 21 TAB) 10 MG  (21) TBPK tablet Taper as directed. (Patient not taking: Reported on 11/25/2019)   No facility-administered medications prior to visit.    Review of Systems  Constitutional: Negative for appetite change, chills and fever.  HENT: Negative.   Eyes: Negative.   Respiratory: Negative for chest tightness, shortness of breath and wheezing.   Cardiovascular: Negative for chest pain and palpitations.  Gastrointestinal: Negative for abdominal pain, nausea and vomiting.  Endocrine: Negative.   Genitourinary: Positive for flank pain and pelvic pain.  Allergic/Immunologic: Negative.   Psychiatric/Behavioral: Negative.        Objective    BP 118/78 (BP Location: Right Arm, Patient Position: Sitting, Cuff Size: Large)   Pulse 90   Temp (!) 96.6 F (35.9 C) (Temporal)   Ht 6' (1.829 m)   Wt 238 lb 9.6 oz (108.2 kg)   BMI 32.36 kg/m  BP Readings from Last 3 Encounters:  03/03/20 118/78  11/25/19 108/72  05/22/19 128/74   Wt Readings from Last 3 Encounters:  03/03/20 238 lb 9.6 oz (108.2 kg)  11/25/19 247 lb (112 kg)  05/22/19 257 lb (116.6 kg)      Physical Exam Vitals reviewed.  Constitutional:      Appearance: He is well-developed.  HENT:     Head: Normocephalic and atraumatic.     Right Ear: External ear normal.     Left Ear: External ear normal.     Nose: Nose normal.  Eyes:  General: No scleral icterus.    Conjunctiva/sclera: Conjunctivae normal.  Neck:     Thyroid: No thyromegaly.  Cardiovascular:     Rate and Rhythm: Normal rate and regular rhythm.     Heart sounds: Normal heart sounds.  Pulmonary:     Effort: Pulmonary effort is normal.     Breath sounds: Normal breath sounds.  Abdominal:     Palpations: Abdomen is soft.     Tenderness: There is no right CVA tenderness or left CVA tenderness.  Skin:    General: Skin is warm and dry.  Neurological:     Mental Status: He is alert and oriented to person, place, and time.  Psychiatric:        Mood and  Affect: Mood normal.        Behavior: Behavior normal.        Thought Content: Thought content normal.        Judgment: Judgment normal.       No results found for any visits on 03/03/20.  Assessment & Plan     1. Flank pain Patient may have passed a stone.  At this time will push fluids.  If this persist may need KUB followed by renal ultrasound. - POCT urinalysis dipstick  2. Essential hypertension Good control.  3. History of testicular cancer    No follow-ups on file.      I, [name], MD, have reviewed all documentation for this visit. The documentation on 03/10/20 for the exam, diagnosis, procedures, and orders are all accurate and complete.    Aris Moman Cranford Mon, MD  [facility] [phone] (phone) [phone] (fax)  [place]

## 2020-03-03 ENCOUNTER — Other Ambulatory Visit: Payer: Self-pay

## 2020-03-03 ENCOUNTER — Ambulatory Visit: Payer: Managed Care, Other (non HMO) | Admitting: Family Medicine

## 2020-03-03 ENCOUNTER — Encounter: Payer: Self-pay | Admitting: Family Medicine

## 2020-03-03 VITALS — BP 118/78 | HR 90 | Temp 96.6°F | Ht 72.0 in | Wt 238.6 lb

## 2020-03-03 DIAGNOSIS — R109 Unspecified abdominal pain: Secondary | ICD-10-CM

## 2020-03-03 DIAGNOSIS — I10 Essential (primary) hypertension: Secondary | ICD-10-CM | POA: Diagnosis not present

## 2020-03-03 DIAGNOSIS — Z8547 Personal history of malignant neoplasm of testis: Secondary | ICD-10-CM | POA: Diagnosis not present

## 2020-03-03 LAB — POCT URINALYSIS DIPSTICK
Bilirubin, UA: NEGATIVE
Blood, UA: NEGATIVE
Glucose, UA: NEGATIVE
Ketones, UA: NEGATIVE
Leukocytes, UA: NEGATIVE
Nitrite, UA: NEGATIVE
Protein, UA: NEGATIVE
Spec Grav, UA: 1.01 (ref 1.010–1.025)
Urobilinogen, UA: 0.2 E.U./dL
pH, UA: 7 (ref 5.0–8.0)

## 2020-03-04 ENCOUNTER — Ambulatory Visit: Payer: Managed Care, Other (non HMO) | Admitting: Family Medicine

## 2020-05-20 NOTE — Progress Notes (Deleted)
Complete physical exam   Patient: Peter Ray   DOB: 09-Nov-1970   49 y.o. Male  MRN: [id number] Visit Date: 05/25/2020  Today's healthcare provider: [name], MD   No chief complaint on file.  Subjective    Peter Ray is a 49 y.o. male who presents today for a complete physical exam.  He reports consuming a {diet types:17450} diet. {Exercise:19826} He generally feels {well/fairly well/poorly:18703}. He reports sleeping {well/fairly well/poorly:18703}. He {does/does not:200015} have additional problems to discuss today.  HPI  ***  Past Medical History:  Diagnosis Date  . Allergy   . Cancer ([place])    testicular ca. 12 yrs. radiation and surgery   Past Surgical History:  Procedure Laterality Date  . TESTICLE REMOVAL Right 2001   secondary to cancer  . TONSILLECTOMY     Social History   Socioeconomic History  . Marital status: Married    Spouse name: Not on file  . Number of children: Not on file  . Years of education: Not on file  . Highest education level: Not on file  Occupational History  . Not on file  Tobacco Use  . Smoking status: Never Smoker  . Smokeless tobacco: Never Used  Substance and Sexual Activity  . Alcohol use: Yes    Alcohol/week: 0.0 standard drinks    Comment: occasional   . Drug use: No  . Sexual activity: Not on file  Other Topics Concern  . Not on file  Social History Narrative  . Not on file   Social Determinants of Health   Financial Resource Strain:   . Difficulty of Paying Living Expenses:   Food Insecurity:   . Worried About Charity fundraiser in the Last Year:   . Arboriculturist in the Last Year:   Transportation Needs:   . Film/video editor (Medical):   Marland Kitchen Lack of Transportation (Non-Medical):   Physical Activity:   . Days of Exercise per Week:   . Minutes of Exercise per Session:   Stress:   . Feeling of Stress :   Social Connections:   . Frequency of Communication with Friends and Family:   .  Frequency of Social Gatherings with Friends and Family:   . Attends Religious Services:   . Active Member of Clubs or Organizations:   . Attends Archivist Meetings:   Marland Kitchen Marital Status:   Intimate Partner Violence:   . Fear of Current or Ex-Partner:   . Emotionally Abused:   Marland Kitchen Physically Abused:   . Sexually Abused:    Family Status  Relation Name Status  . Mother  Alive  . Father  Deceased  . Daughter  Alive  . Son  Alive  . Son  Alive   No family history on file. Allergies  Allergen Reactions  . Iodinated Diagnostic Agents     Contrast, 12 yrs ago hives, itching, difficulty breathing.   . Levofloxacin   . Sertraline Hives  . Shellfish Allergy   . Tree Extract     and grass  . Bupropion Rash    Also lips were a little swollen    Patient Care Team: Jerrol Banana., MD as PCP - General (Family Medicine)   Medications: Outpatient Medications Prior to Visit  Medication Sig  . amLODipine (NORVASC) 5 MG tablet Take 1 tablet (5 mg total) by mouth daily. (Patient not taking: Reported on 05/15/2019)  . fexofenadine (ALLEGRA) 60 MG tablet Take 60 mg  by mouth 2 (two) times daily.  . fluticasone (FLONASE) 50 MCG/ACT nasal spray Place into both nostrils daily as needed.   . meloxicam (MOBIC) 15 MG tablet Take 15 mg by mouth daily.  Marland Kitchen telmisartan (MICARDIS) 80 MG tablet TAKE 1/2 TABLET(40 MG) BY MOUTH DAILY  . valACYclovir (VALTREX) 1000 MG tablet Take 1 tablet (1,000 mg total) by mouth 3 (three) times daily. (Patient not taking: Reported on 05/15/2019)   No facility-administered medications prior to visit.    Review of Systems  {Heme  Chem  Endocrine  Serology  Results Review (optional):23779::" "}  Objective    There were no vitals taken for this visit. {Show previous vital signs (optional):23777::" "}  Physical Exam  ***  Last depression screening scores PHQ 2/9 Scores 05/22/2019 11/08/2017 08/13/2015  PHQ - 2 Score 1 0 0  PHQ- 9 Score 4 - 2   Last  fall risk screening Fall Risk  05/22/2019  Falls in the past year? 0  Number falls in past yr: 0  Injury with Fall? 0  Follow up Falls evaluation completed   Last Audit-C alcohol use screening Alcohol Use Disorder Test (AUDIT) 05/22/2019  1. How often do you have a drink containing alcohol? 1  2. How many drinks containing alcohol do you have on a typical day when you are drinking? 0  3. How often do you have six or more drinks on one occasion? 0  AUDIT-C Score 1  Alcohol Brief Interventions/Follow-up AUDIT Score <7 follow-up not indicated   A score of 3 or more in women, and 4 or more in men indicates increased risk for alcohol abuse, EXCEPT if all of the points are from question 1   No results found for any visits on 05/25/20.  Assessment & Plan    Routine Health Maintenance and Physical Exam  Exercise Activities and Dietary recommendations Goals   None     Immunization History  Administered Date(s) Administered  . DTaP 08/07/1974  . Hepatitis B 06/07/2011, 07/08/2011  . IPV 08/07/1974, 06/02/1981  . MMR 11/22/1972, 07/08/2011  . Td 03/31/1978, 06/02/1981, 08/18/1990  . Tdap 06/07/2011  . Varicella 06/10/2011, 07/08/2011    Health Maintenance  Topic Date Due  . Hepatitis C Screening  Never done  . COVID-19 Vaccine (1) Never done  . HIV Screening  Never done  . INFLUENZA VACCINE  05/10/2020  . TETANUS/TDAP  06/06/2021    Discussed health benefits of physical activity, and encouraged him to engage in regular exercise appropriate for his age and condition.  ***  No follow-ups on file.     {provider attestation***:1}   [name], MD  [facility] [phone] (phone) [phone] (fax)  [place]

## 2020-05-25 ENCOUNTER — Ambulatory Visit: Payer: Self-pay | Admitting: Family Medicine

## 2020-05-28 NOTE — Progress Notes (Signed)
Trena Platt Cummings,acting as a scribe for [name], MD.,have documented all relevant documentation on the behalf of [name], MD,as directed by  [name], MD while in the presence of [name], MD.  Complete physical exam   Patient: Peter Ray   DOB: Aug 30, 1971   49 y.o. Male  MRN: [id number] Visit Date: 06/01/2020  Today's healthcare provider: [name], MD   Chief Complaint  Patient presents with  . Annual Exam   Subjective    Peter Ray is a 49 y.o. male who presents today for a complete physical exam.  He reports consuming a low sodium diet. Home exercise routine includes swimming. He generally feels fairly well. He reports sleeping well. He does not have additional problems to discuss today.  There has been some recent family stress as his father-in-law was recently killed in a head-on MVA. Patient has had a promotion at work and is excited about this. HPI    Past Medical History:  Diagnosis Date  . Allergy   . 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 TESTICLE REMOVAL Right 2001   secondary to cancer  . TONSILLECTOMY     Social History   Socioeconomic History  . Marital status: Married    Spouse name: Not on file  . Number of children: Not on file  . Years of education: Not on file  . Highest education level: Not on file  Occupational History  . Not on file  Tobacco Use  . Smoking status: Never Smoker  . Smokeless tobacco: Never Used  Substance and Sexual Activity  . Alcohol use: Yes    Alcohol/week: 0.0 standard drinks    Comment: occasional   . Drug use: No  . Sexual activity: Not on file  Other Topics Concern  . Not on file  Social History Narrative  . Not on file   Social Determinants of Health   Financial Resource Strain:   . Difficulty of Paying Living Expenses: Not on file  Food Insecurity:   . Worried About  Charity fundraiser in the Last Year: Not on file  . Ran Out of Food in the Last Year: Not on file  Transportation Needs:   . Lack of Transportation (Medical): Not on file  . Lack of Transportation (Non-Medical): Not on file  Physical Activity:   . Days of Exercise per Week: Not on file  . Minutes of Exercise per Session: Not on file  Stress:   . Feeling of Stress : Not on file  Social Connections:   . Frequency of Communication with Friends and Family: Not on file  . Frequency of Social Gatherings with Friends and Family: Not on file  . Attends Religious Services: Not on file  . Active Member of Clubs or Organizations: Not on file  . Attends Archivist Meetings: Not on file  . Marital Status: Not on file  Intimate Partner Violence:   . Fear of Current or Ex-Partner: Not on file  . Emotionally Abused: Not on file  . Physically Abused: Not on file  . Sexually Abused: Not on file   Family Status  Relation Name Status  . Mother  Alive  . Father  Deceased  . Daughter  Alive  . Son  Alive  . Son  Alive   No family history on file. Allergies  Allergen Reactions  .  Iodinated Diagnostic Agents     Contrast, 12 yrs ago hives, itching, difficulty breathing.   . Levofloxacin   . Sertraline Hives  . Shellfish Allergy   . Tree Extract     and grass  . Bupropion Rash    Also lips were a little swollen    Patient Care Team: Jerrol Banana., MD as PCP - General (Family Medicine)   Medications: Outpatient Medications Prior to Visit  Medication Sig  . fexofenadine (ALLEGRA) 60 MG tablet Take 60 mg by mouth 2 (two) times daily.  . fluticasone (FLONASE) 50 MCG/ACT nasal spray Place into both nostrils daily as needed.   . meloxicam (MOBIC) 15 MG tablet Take 15 mg by mouth daily.  Marland Kitchen telmisartan (MICARDIS) 80 MG tablet TAKE 1/2 TABLET(40 MG) BY MOUTH DAILY  . [DISCONTINUED] amLODipine (NORVASC) 5 MG tablet Take 1 tablet (5 mg total) by mouth daily. (Patient not taking:  Reported on 05/15/2019)  . [DISCONTINUED] valACYclovir (VALTREX) 1000 MG tablet Take 1 tablet (1,000 mg total) by mouth 3 (three) times daily. (Patient not taking: Reported on 05/15/2019)   No facility-administered medications prior to visit.    Review of Systems  Constitutional: Negative.   HENT: Negative.   Eyes: Negative.   Respiratory: Negative.   Cardiovascular: Negative.   Gastrointestinal: Negative.   Endocrine: Negative.   Genitourinary: Negative.   Musculoskeletal: Negative.   Skin: Negative.   Allergic/Immunologic: Positive for food allergies.  Neurological: Positive for headaches.  Hematological: Negative.   Psychiatric/Behavioral: Negative.        Objective    BP 113/68 (BP Location: Right Arm, Patient Position: Sitting, Cuff Size: Large)   Pulse 81   Temp 98.4 F (36.9 C) (Oral)   Ht 6' (1.829 m)   Wt 240 lb 6.4 oz (109 kg)   BMI 32.60 kg/m  BP Readings from Last 3 Encounters:  06/01/20 113/68  03/03/20 118/78  11/25/19 108/72   Wt Readings from Last 3 Encounters:  06/01/20 240 lb 6.4 oz (109 kg)  03/03/20 238 lb 9.6 oz (108.2 kg)  11/25/19 247 lb (112 kg)      Physical Exam Vitals reviewed.  Constitutional:      Appearance: Normal appearance.  HENT:     Head: Normocephalic and atraumatic.     Right Ear: External ear normal.     Left Ear: External ear normal.  Eyes:     General: No scleral icterus.    Conjunctiva/sclera: Conjunctivae normal.  Cardiovascular:     Rate and Rhythm: Normal rate and regular rhythm.     Pulses: Normal pulses.     Heart sounds: Normal heart sounds.  Pulmonary:     Effort: Pulmonary effort is normal.     Breath sounds: Normal breath sounds.  Musculoskeletal:     Right lower leg: No edema.     Left lower leg: No edema.  Skin:    General: Skin is warm and dry.  Neurological:     General: No focal deficit present.     Mental Status: He is alert and oriented to person, place, and time.  Psychiatric:        Mood  and Affect: Mood normal.        Behavior: Behavior normal.        Thought Content: Thought content normal.        Judgment: Judgment normal.       Last depression screening scores PHQ 2/9 Scores 06/01/2020 05/22/2019 11/08/2017  PHQ - 2  Score 1 1 0  PHQ- 9 Score 1 4 -   Last fall risk screening Fall Risk  06/01/2020  Falls in the past year? 0  Number falls in past yr: 0  Injury with Fall? 0  Follow up Falls evaluation completed   Last Audit-C alcohol use screening Alcohol Use Disorder Test (AUDIT) 06/01/2020  1. How often do you have a drink containing alcohol? 2  2. How many drinks containing alcohol do you have on a typical day when you are drinking? 0  3. How often do you have six or more drinks on one occasion? 0  AUDIT-C Score 2  Alcohol Brief Interventions/Follow-up -   A score of 3 or more in women, and 4 or more in men indicates increased risk for alcohol abuse, EXCEPT if all of the points are from question 1   No results found for any visits on 06/01/20.  Assessment & Plan    Routine Health Maintenance and Physical Exam  Exercise Activities and Dietary recommendations Goals   None     Immunization History  Administered Date(s) Administered  . DTaP 08/07/1974  . Hepatitis B 06/07/2011, 07/08/2011  . IPV 08/07/1974, 06/02/1981  . MMR 11/22/1972, 07/08/2011  . Td 03/31/1978, 06/02/1981, 08/18/1990  . Tdap 06/07/2011  . Varicella 06/10/2011, 07/08/2011    Health Maintenance  Topic Date Due  . Hepatitis C Screening  Never done  . COVID-19 Vaccine (1) Never done  . HIV Screening  Never done  . INFLUENZA VACCINE  05/10/2020  . TETANUS/TDAP  06/06/2021    Discussed health benefits of physical activity, and encouraged him to engage in regular exercise appropriate for his age and condition.  1. Annual physical exam Colonoscopy age 27 - CBC - Comprehensive metabolic panel - Lipid panel - TSH - Urinalysis dipstick - POCT - PSA  2. Essential  hypertension Follow-up 6 months. - Comprehensive metabolic panel - Lipid panel - TSH  3. Screening for hematuria or proteinuria  - Urinalysis dipstick - POCT  4. Need for hepatitis C screening test  - Hepatitis C antibody  5. Encounter for screening for HIV  - HIV Antibody (routine testing w rflx)  6. Prostate cancer screening  - PSA  7. Screening for thyroid disorder  - TSH 8.  Status post orchiectomy for testicular cancer   Return in about 1 year (around 06/01/2021) for CPE.        Chardonay Scritchfield Cranford Mon, MD  [facility] [phone] (phone) [phone] (fax)  [place]

## 2020-06-01 ENCOUNTER — Encounter: Payer: Self-pay | Admitting: Family Medicine

## 2020-06-01 ENCOUNTER — Other Ambulatory Visit: Payer: Self-pay

## 2020-06-01 ENCOUNTER — Ambulatory Visit (INDEPENDENT_AMBULATORY_CARE_PROVIDER_SITE_OTHER): Payer: Managed Care, Other (non HMO) | Admitting: Family Medicine

## 2020-06-01 VITALS — BP 113/68 | HR 81 | Temp 98.4°F | Ht 72.0 in | Wt 240.4 lb

## 2020-06-01 DIAGNOSIS — Z0000 Encounter for general adult medical examination without abnormal findings: Secondary | ICD-10-CM

## 2020-06-01 DIAGNOSIS — Z1389 Encounter for screening for other disorder: Secondary | ICD-10-CM

## 2020-06-01 DIAGNOSIS — I10 Essential (primary) hypertension: Secondary | ICD-10-CM | POA: Diagnosis not present

## 2020-06-01 DIAGNOSIS — Z125 Encounter for screening for malignant neoplasm of prostate: Secondary | ICD-10-CM

## 2020-06-01 DIAGNOSIS — Z8547 Personal history of malignant neoplasm of testis: Secondary | ICD-10-CM

## 2020-06-01 DIAGNOSIS — Z1159 Encounter for screening for other viral diseases: Secondary | ICD-10-CM | POA: Diagnosis not present

## 2020-06-01 DIAGNOSIS — Z114 Encounter for screening for human immunodeficiency virus [HIV]: Secondary | ICD-10-CM

## 2020-06-01 DIAGNOSIS — Z1329 Encounter for screening for other suspected endocrine disorder: Secondary | ICD-10-CM

## 2020-06-02 LAB — LIPID PANEL
Chol/HDL Ratio: 4.7 ratio (ref 0.0–5.0)
Cholesterol, Total: 206 mg/dL — ABNORMAL HIGH (ref 100–199)
HDL: 44 mg/dL (ref >39)
LDL Chol Calc (NIH): 141 mg/dL — ABNORMAL HIGH (ref 0–99)
Triglycerides: 118 mg/dL (ref 0–149)
VLDL Cholesterol Cal: 21 mg/dL (ref 5–40)

## 2020-06-02 LAB — COMPREHENSIVE METABOLIC PANEL
ALT: 16 IU/L (ref 0–44)
AST: 19 IU/L (ref 0–40)
Albumin/Globulin Ratio: 1.6 (ref 1.2–2.2)
Albumin: 4.6 g/dL (ref 4.0–5.0)
Alkaline Phosphatase: 77 IU/L (ref 48–121)
BUN/Creatinine Ratio: 20 (ref 9–20)
BUN: 20 mg/dL (ref 6–24)
Bilirubin Total: 0.5 mg/dL (ref 0.0–1.2)
CO2: 24 mmol/L (ref 20–29)
Calcium: 9.4 mg/dL (ref 8.7–10.2)
Chloride: 100 mmol/L (ref 96–106)
Creatinine, Ser: 1.01 mg/dL (ref 0.76–1.27)
GFR calc Af Amer: 101 mL/min/{1.73_m2} (ref >59)
GFR calc non Af Amer: 88 mL/min/{1.73_m2} (ref >59)
Globulin, Total: 2.8 g/dL (ref 1.5–4.5)
Glucose: 82 mg/dL (ref 65–99)
Potassium: 4.3 mmol/L (ref 3.5–5.2)
Sodium: 138 mmol/L (ref 134–144)
Total Protein: 7.4 g/dL (ref 6.0–8.5)

## 2020-06-02 LAB — CBC
Hematocrit: 42.2 % (ref 37.5–51.0)
Hemoglobin: 14.4 g/dL (ref 13.0–17.7)
MCH: 30.8 pg (ref 26.6–33.0)
MCHC: 34.1 g/dL (ref 31.5–35.7)
MCV: 90 fL (ref 79–97)
Platelets: 235 10*3/uL (ref 150–450)
RBC: 4.68 x10E6/uL (ref 4.14–5.80)
RDW: 12.2 % (ref 11.6–15.4)
WBC: 8.2 10*3/uL (ref 3.4–10.8)

## 2020-06-02 LAB — TSH: TSH: 1.77 u[IU]/mL (ref 0.450–4.500)

## 2020-06-02 LAB — PSA: Prostate Specific Ag, Serum: 0.4 ng/mL (ref 0.0–4.0)

## 2020-06-02 LAB — HEPATITIS C ANTIBODY: Hep C Virus Ab: 0.2 s/co ratio (ref 0.0–0.9)

## 2020-06-02 LAB — HIV ANTIBODY (ROUTINE TESTING W REFLEX): HIV Screen 4th Generation wRfx: NONREACTIVE

## 2020-06-04 ENCOUNTER — Telehealth: Payer: Self-pay

## 2020-06-04 NOTE — Telephone Encounter (Signed)
Called to advise patient of labs and patient also advised through mychart.

## 2020-06-04 NOTE — Telephone Encounter (Signed)
-----   Message from Jerrol Banana., MD sent at 06/04/2020 12:56 PM EDT ----- Labs stable.

## 2020-08-10 ENCOUNTER — Encounter: Payer: Self-pay | Admitting: Family Medicine

## 2020-08-10 ENCOUNTER — Ambulatory Visit: Payer: Managed Care, Other (non HMO) | Admitting: Family Medicine

## 2020-08-10 ENCOUNTER — Other Ambulatory Visit: Payer: Self-pay

## 2020-08-10 VITALS — BP 124/86 | HR 86 | Temp 98.3°F | Resp 16 | Wt 238.0 lb

## 2020-08-10 DIAGNOSIS — I10 Essential (primary) hypertension: Secondary | ICD-10-CM

## 2020-08-10 MED ORDER — METOPROLOL SUCCINATE 25 MG PO CS24: 25.0000 mg | EXTENDED_RELEASE_CAPSULE | Freq: Every evening | ORAL | 5 refills | Status: DC

## 2020-08-10 NOTE — Progress Notes (Signed)
I,[name],acting as a scribe for [name], MD.,have documented all relevant documentation on the behalf of [name], MD,as directed by  [name], MD while in the presence of [name], MD.   Established patient visit   Patient: Peter Ray   DOB: Nov 22, 1970   49 y.o. Male  MRN: [id number] Visit Date: 08/10/2020  Today's healthcare provider: [name], MD   Chief Complaint  Patient presents with  . Follow-up  . Hypertension   Subjective    HPI Hypertension, follow-up  BP Readings from Last 3 Encounters:  08/10/20 124/86  06/01/20 113/68  03/03/20 118/78   Wt Readings from Last 3 Encounters:  08/10/20 238 lb (108 kg)  06/01/20 240 lb 6.4 oz (109 kg)  03/03/20 238 lb 9.6 oz (108.2 kg)     He was last seen for hypertension 3 months ago.  BP at that visit was 113/68. Management since that visit includes; on telmisartan. He reports good compliance with treatment. He is not having side effects. none He is not exercising. He some adherent to low salt diet.   Outside blood pressures are 148/95, 180/100.  He does not smoke.  Use of agents associated with hypertension: none.   --------------------------------------------------------------------  Patient states his blood pressure has been running high at home 148/95 to 150/100. Patient states he is also having symptoms of lingering headaches, jaw pain, and occasional dizziness.  No exertional symptoms.      Medications: Outpatient Medications Prior to Visit  Medication Sig  . fexofenadine (ALLEGRA) 60 MG tablet Take 60 mg by mouth 2 (two) times daily.  . fluticasone (FLONASE) 50 MCG/ACT nasal spray Place into both nostrils daily as needed.   Marland Kitchen telmisartan (MICARDIS) 80 MG tablet TAKE 1/2 TABLET(40 MG) BY MOUTH DAILY  . meloxicam (MOBIC) 15 MG tablet Take 15 mg by mouth daily. (Patient not taking: Reported on 08/10/2020)   No facility-administered medications prior  to visit.    Review of Systems     Objective    BP 124/86 (BP Location: Left Arm, Patient Position: Sitting, Cuff Size: Large)   Pulse 86   Temp 98.3 F (36.8 C) (Oral)   Resp 16   Wt 238 lb (108 kg)   SpO2 99%   BMI 32.28 kg/m  BP Readings from Last 3 Encounters:  08/10/20 124/86  06/01/20 113/68  03/03/20 118/78   Wt Readings from Last 3 Encounters:  08/10/20 238 lb (108 kg)  06/01/20 240 lb 6.4 oz (109 kg)  03/03/20 238 lb 9.6 oz (108.2 kg)      Physical Exam Vitals reviewed.  Constitutional:      Appearance: Normal appearance.  HENT:     Head: Normocephalic and atraumatic.     Right Ear: External ear normal.     Left Ear: External ear normal.     Mouth/Throat:     Pharynx: Oropharynx is clear.  Eyes:     General: No scleral icterus.    Conjunctiva/sclera: Conjunctivae normal.  Cardiovascular:     Rate and Rhythm: Normal rate and regular rhythm.     Pulses: Normal pulses.     Heart sounds: Normal heart sounds.  Pulmonary:     Effort: Pulmonary effort is normal.     Breath sounds: Normal breath sounds.  Musculoskeletal:     Right lower leg: No edema.     Left lower leg: No edema.  Skin:    General: Skin is warm and dry.  Neurological:     General: No focal deficit present.     Mental Status: He is alert and oriented to person, place, and time.  Psychiatric:        Mood and Affect: Mood normal.        Behavior: Behavior normal.        Thought Content: Thought content normal.        Judgment: Judgment normal.       No results found for any visits on 08/10/20.  Assessment & Plan     1. Essential hypertension Added metoprolol 25 mg. - Metoprolol Succinate 25 MG CS24; Take 25 mg by mouth every evening.  Dispense: 30 capsule; Refill: 5   No follow-ups on file.      I, [name], MD, have reviewed all documentation for this visit. The documentation on 08/15/20 for the exam, diagnosis, procedures, and orders are all accurate and  complete.    Hannah Strader Cranford Mon, MD  [facility] [phone] (phone) [phone] (fax)  [place]

## 2020-08-13 ENCOUNTER — Encounter: Payer: Self-pay | Admitting: Family Medicine

## 2020-08-18 ENCOUNTER — Telehealth: Payer: Self-pay

## 2020-08-18 DIAGNOSIS — I10 Essential (primary) hypertension: Secondary | ICD-10-CM

## 2020-08-18 MED ORDER — METOPROLOL SUCCINATE ER 25 MG PO TB24: 25.0000 mg | ORAL_TABLET | Freq: Every day | ORAL | 5 refills | Status: DC

## 2020-08-18 NOTE — Telephone Encounter (Signed)
Copied from [place] [id number]. Topic: General - Other >> Aug 17, 2020  4:13 PM Keene Breath wrote: Reason for CRM: Patient would like the nurse to call him regarding his medication that was cancelled because he said the pharmacy told him it was not going to be covered by his insurance.  Patient is not sure why this is the case but said he would call his insurance company to get some clarity.  Patient would also like the nurse to call him to make sure that it is the correct BP medication.  CB# [phone]

## 2020-08-18 NOTE — Telephone Encounter (Signed)
Called pharmacy. Previous rx was for capsules which pt's insurance does not cover. Resent tablets. Left detailed massage on pt's vm.

## 2020-09-16 ENCOUNTER — Telehealth (INDEPENDENT_AMBULATORY_CARE_PROVIDER_SITE_OTHER): Payer: Managed Care, Other (non HMO) | Admitting: Family Medicine

## 2020-09-16 ENCOUNTER — Encounter: Payer: Self-pay | Admitting: Family Medicine

## 2020-09-16 VITALS — Wt 240.0 lb

## 2020-09-16 DIAGNOSIS — R059 Cough, unspecified: Secondary | ICD-10-CM

## 2020-09-16 DIAGNOSIS — J069 Acute upper respiratory infection, unspecified: Secondary | ICD-10-CM

## 2020-09-16 NOTE — Progress Notes (Signed)
MyChart Video Visit    Virtual Visit via Video Note   This visit type was conducted due to national recommendations for restrictions regarding the COVID-19 Pandemic (e.g. social distancing) in an effort to limit this patient's exposure and mitigate transmission in our community. This patient is at least at moderate risk for complications without adequate follow up. This format is felt to be most appropriate for this patient at this time. Physical exam was limited by quality of the video and audio technology used for the visit.   Patient location: home Provider location: office This ended up being a phone note as  the patient could not get the computer to work I discussed the limitations of evaluation and management by telemedicine and the availability of in person appointments. The patient expressed understanding and agreed to proceed.  Patient: Peter Ray   DOB: [birth year]   49 y.o. Male  MRN: [id number] Visit Date: 09/16/2020  Today's healthcare provider: [name], MD   Chief Complaint  Patient presents with  . URI    Cough is worse in the morning and evening. Patient reports cold air makes cough worse. Patient reports he did take OTC cold medications last week, none this week.    Subjective    HPI HPI    URI    URI symptoms: cough, congestion and plugged ear sensation   Onset: 1 to 4 weeks ago   Progression since onset: gradually improving since onset   Fever: temperature has been with in normal range   Hydration: is drinking plenty of fluids   Comments: Cough is worse in the morning and evening. Patient reports cold air makes cough worse. Patient reports he did take OTC cold medications last week, none this week.        Last edited by Dorian Pod, CMA on 09/16/2020  8:10 AM. (History)    Patient complains of cough for 2 weeks.  At first it felt like it might be productive but since then it is improved and he feels much better.  He has had no fever  chills or any other systemic symptoms.  He is fully Covid vaccinated.  Patient Active Problem List   Diagnosis Date Noted  . HTN (hypertension) 09/29/2018  . History of testicular cancer 07/18/2016  . Allergic rhinitis 08/13/2015  . Elevated blood pressure reading 08/13/2015  . Feeling stressed out 08/13/2015   Social History   Socioeconomic History  . Marital status: Married    Spouse name: Not on file  . Number of children: Not on file  . Years of education: Not on file  . Highest education level: Not on file  Occupational History  . Not on file  Tobacco Use  . Smoking status: Never Smoker  . Smokeless tobacco: Never Used  Substance and Sexual Activity  . Alcohol use: Yes    Alcohol/week: 0.0 standard drinks    Comment: occasional   . Drug use: No  . Sexual activity: Not on file  Other Topics Concern  . Not on file  Social History Narrative  . Not on file   Social Determinants of Health   Financial Resource Strain:   . Difficulty of Paying Living Expenses: Not on file  Food Insecurity:   . Worried About Charity fundraiser in the Last Year: Not on file  . Ran Out of Food in the Last Year: Not on file  Transportation Needs:   . Lack of Transportation (Medical): Not on file  .  Lack of Transportation (Non-Medical): Not on file  Physical Activity:   . Days of Exercise per Week: Not on file  . Minutes of Exercise per Session: Not on file  Stress:   . Feeling of Stress : Not on file  Social Connections:   . Frequency of Communication with Friends and Family: Not on file  . Frequency of Social Gatherings with Friends and Family: Not on file  . Attends Religious Services: Not on file  . Active Member of Clubs or Organizations: Not on file  . Attends Archivist Meetings: Not on file  . Marital Status: Not on file  Intimate Partner Violence:   . Fear of Current or Ex-Partner: Not on file  . Emotionally Abused: Not on file  . Physically Abused: Not on file   . Sexually Abused: Not on file   Allergies  Allergen Reactions  . Iodinated Diagnostic Agents     Contrast, 12 yrs ago hives, itching, difficulty breathing.   . Levofloxacin   . Sertraline Hives  . Shellfish Allergy   . Tree Extract     and grass  . Bupropion Rash    Also lips were a little swollen      Medications: Outpatient Medications Prior to Visit  Medication Sig  . fexofenadine (ALLEGRA) 60 MG tablet Take 60 mg by mouth 2 (two) times daily.  . fluticasone (FLONASE) 50 MCG/ACT nasal spray Place into both nostrils daily as needed.   . metoprolol succinate (TOPROL-XL) 25 MG 24 hr tablet Take 1 tablet (25 mg total) by mouth daily.  Marland Kitchen telmisartan (MICARDIS) 80 MG tablet TAKE 1/2 TABLET(40 MG) BY MOUTH DAILY  . meloxicam (MOBIC) 15 MG tablet Take 15 mg by mouth daily. (Patient not taking: Reported on 08/10/2020)   No facility-administered medications prior to visit.    Review of Systems  Constitutional: Negative for activity change, appetite change, chills and fever.  HENT: Positive for congestion, ear pain and postnasal drip. Negative for rhinorrhea, sinus pressure, sinus pain and sore throat.   Respiratory: Positive for cough. Negative for chest tightness, shortness of breath and wheezing.   Cardiovascular: Negative for chest pain and palpitations.  Gastrointestinal: Negative for nausea and vomiting.    Last CBC Lab Results  Component Value Date   WBC 8.2 06/01/2020   HGB 14.4 06/01/2020   HCT 42.2 06/01/2020   MCV 90 06/01/2020   MCH 30.8 06/01/2020   RDW 12.2 06/01/2020   PLT 235 06/01/2020      Objective    Wt 240 lb (108.9 kg)   BMI 32.55 kg/m  BP Readings from Last 3 Encounters:  08/10/20 124/86  06/01/20 113/68  03/03/20 118/78   Wt Readings from Last 3 Encounters:  09/16/20 240 lb (108.9 kg)  08/10/20 238 lb (108 kg)  06/01/20 240 lb 6.4 oz (109 kg)      Physical Exam Vitals reviewed.   He is able to speak in full sentences and has no  coughing or wheezing during the phone exam.    Assessment & Plan     1. Viral upper respiratory tract infection Discussed etiology and that he is getting better and so no antibiotic treatment or other treatment is needed.  We will start Robitussin twice a day and then vitamin C vitamin D and zinc daily until he is well.  2. Cough He is fully Covid vaccinated.  He declines Covid vaccine today and I think this is reasonable.  He is feeling much better.  No follow-ups on file.     I discussed the assessment and treatment plan with the patient. The patient was provided an opportunity to ask questions and all were answered. The patient agreed with the plan and demonstrated an understanding of the instructions.   The patient was advised to call back or seek an in-person evaluation if the symptoms worsen or if the condition fails to improve as anticipated.  I provided 10 minutes of non-face-to-face time during this encounter.    Rhyli Depaula Cranford Mon, MD [facility] [phone] (phone) [phone] (fax)  [place]

## 2020-10-06 NOTE — Progress Notes (Deleted)
      Established patient visit   Patient: Peter Ray   DOB: 09-14-1971   49 y.o. Male  MRN: [id number] Visit Date: 10/12/2020  Today's healthcare provider: Megan Mans, MD   No chief complaint on file.  Subjective    HPI  Hypertension, follow-up  BP Readings from Last 3 Encounters:  08/10/20 124/86  06/01/20 113/68  03/03/20 118/78   Wt Readings from Last 3 Encounters:  09/16/20 240 lb (108.9 kg)  08/10/20 238 lb (108 kg)  06/01/20 240 lb 6.4 oz (109 kg)     He was last seen for hypertension 2 months ago.  BP at that visit was 124/86. Management since that visit includes; Added metoprolol 25 mg. He reports {excellent/good/fair/poor:19665} compliance with treatment. He {is/is not:9024} having side effects. {document side effects if present:1} He {is/is not:9024} exercising. He {is/is not:9024} adherent to low salt diet.   Outside blood pressures are {enter patient reported home BP, or 'not being checked':1}.  He {does/does not:200015} smoke.  Use of agents associated with hypertension: {bp agents assoc with hypertension:511::"none"}.   ---------------------------------------------------------------------------------------------------   {Show patient history (optional):23778::" "}   Medications: Outpatient Medications Prior to Visit  Medication Sig  . fexofenadine (ALLEGRA) 60 MG tablet Take 60 mg by mouth 2 (two) times daily.  . fluticasone (FLONASE) 50 MCG/ACT nasal spray Place into both nostrils daily as needed.   . meloxicam (MOBIC) 15 MG tablet Take 15 mg by mouth daily. (Patient not taking: Reported on 08/10/2020)  . metoprolol succinate (TOPROL-XL) 25 MG 24 hr tablet Take 1 tablet (25 mg total) by mouth daily.  Marland Kitchen telmisartan (MICARDIS) 80 MG tablet TAKE 1/2 TABLET(40 MG) BY MOUTH DAILY   No facility-administered medications prior to visit.    Review of Systems  Constitutional: Negative for appetite change, chills and fever.  Respiratory:  Negative for chest tightness, shortness of breath and wheezing.   Cardiovascular: Negative for chest pain and palpitations.  Gastrointestinal: Negative for abdominal pain, nausea and vomiting.    {Heme  Chem  Endocrine  Serology  Results Review (optional):23779::" "}  Objective    There were no vitals taken for this visit. {Show previous vital signs (optional):23777::" "}  Physical Exam  ***  No results found for any visits on 10/12/20.  Assessment & Plan     ***  No follow-ups on file.      {provider attestation***:1}   Megan Mans, MD  [facility] [phone] (phone) [phone] (fax)  [facility]

## 2020-10-12 ENCOUNTER — Ambulatory Visit: Payer: Self-pay | Admitting: Family Medicine

## 2020-10-15 ENCOUNTER — Encounter: Payer: Self-pay | Admitting: Family Medicine

## 2020-10-15 ENCOUNTER — Other Ambulatory Visit: Payer: Self-pay

## 2020-10-15 ENCOUNTER — Ambulatory Visit (INDEPENDENT_AMBULATORY_CARE_PROVIDER_SITE_OTHER): Payer: Managed Care, Other (non HMO) | Admitting: Family Medicine

## 2020-10-15 VITALS — BP 120/76 | HR 99 | Temp 98.4°F | Resp 16 | Ht 72.0 in | Wt 246.0 lb

## 2020-10-15 DIAGNOSIS — Z8547 Personal history of malignant neoplasm of testis: Secondary | ICD-10-CM | POA: Diagnosis not present

## 2020-10-15 DIAGNOSIS — I10 Essential (primary) hypertension: Secondary | ICD-10-CM

## 2020-10-15 NOTE — Progress Notes (Signed)
Established patient visit   Patient: Peter Ray   DOB: 20-May-1971   50 y.o. Male  MRN: [id number] Visit Date: 10/15/2020  Today's healthcare provider: Megan Mans, MD   Chief Complaint  Patient presents with  . Hypertension   Subjective    HPI  Patient has no complaints and feels well. Hypertension, follow-up  BP Readings from Last 3 Encounters:  10/15/20 120/76  08/10/20 124/86  06/01/20 113/68   Wt Readings from Last 3 Encounters:  10/15/20 246 lb (111.6 kg)  09/16/20 240 lb (108.9 kg)  08/10/20 238 lb (108 kg)     He was last seen for hypertension 2 months ago.  BP at that visit was 124/86. Management since that visit includes add metoprolol 25 mg daily.  He reports good compliance with treatment. He is not having side effects.  He is following a Regular diet. He is not exercising. He does not smoke.  Use of agents associated with hypertension: none.   Outside blood pressures are 140/80. Symptoms: No chest pain No chest pressure  No palpitations No syncope  No dyspnea No orthopnea  No paroxysmal nocturnal dyspnea No lower extremity edema   Pertinent labs: Lab Results  Component Value Date   CHOL 206 (H) 06/01/2020   HDL 44 06/01/2020   LDLCALC 141 (H) 06/01/2020   TRIG 118 06/01/2020   CHOLHDL 4.7 06/01/2020   Lab Results  Component Value Date   NA 138 06/01/2020   K 4.3 06/01/2020   CREATININE 1.01 06/01/2020   GFRNONAA 88 06/01/2020   GFRAA 101 06/01/2020   GLUCOSE 82 06/01/2020     The 10-year ASCVD risk score Denman George DC Jr., et al., 2013) is: 3.9%   ---------------------------------------------------------------------------------------------------   Patient Active Problem List   Diagnosis Date Noted  . HTN (hypertension) 09/29/2018  . History of testicular cancer 07/18/2016  . Allergic rhinitis 08/13/2015  . Elevated blood pressure reading 08/13/2015  . Feeling stressed out 08/13/2015   Social History   Tobacco Use   . Smoking status: Never Smoker  . Smokeless tobacco: Never Used  Substance Use Topics  . Alcohol use: Yes    Alcohol/week: 0.0 standard drinks    Comment: occasional   . Drug use: No   Allergies  Allergen Reactions  . Iodinated Diagnostic Agents     Contrast, 12 yrs ago hives, itching, difficulty breathing.   . Levofloxacin   . Sertraline Hives  . Shellfish Allergy   . Tree Extract     and grass  . Bupropion Rash    Also lips were a little swollen       Medications: Outpatient Medications Prior to Visit  Medication Sig  . fexofenadine (ALLEGRA) 60 MG tablet Take 60 mg by mouth 2 (two) times daily.  . fluticasone (FLONASE) 50 MCG/ACT nasal spray Place into both nostrils daily as needed.   . metoprolol succinate (TOPROL-XL) 25 MG 24 hr tablet Take 1 tablet (25 mg total) by mouth daily. (Patient taking differently: Take 25 mg by mouth as needed.)  . telmisartan (MICARDIS) 80 MG tablet TAKE 1/2 TABLET(40 MG) BY MOUTH DAILY  . [DISCONTINUED] meloxicam (MOBIC) 15 MG tablet Take 15 mg by mouth daily. (Patient not taking: Reported on 10/15/2020)   No facility-administered medications prior to visit.    Review of Systems  Constitutional: Negative.   Respiratory: Negative.   Cardiovascular: Negative.   Gastrointestinal: Negative.     Last CBC Lab Results  Component Value Date  WBC 8.2 06/01/2020   HGB 14.4 06/01/2020   HCT 42.2 06/01/2020   MCV 90 06/01/2020   MCH 30.8 06/01/2020   RDW 12.2 06/01/2020   PLT 235 06/01/2020      Objective    BP 120/76 (BP Location: Left Arm, Patient Position: Sitting, Cuff Size: Large)   Pulse 99   Temp 98.4 F (36.9 C) (Oral)   Resp 16   Ht 6' (1.829 m)   Wt 246 lb (111.6 kg)   SpO2 99%   BMI 33.36 kg/m  BP Readings from Last 3 Encounters:  10/15/20 120/76  08/10/20 124/86  06/01/20 113/68   Wt Readings from Last 3 Encounters:  10/15/20 246 lb (111.6 kg)  09/16/20 240 lb (108.9 kg)  08/10/20 238 lb (108 kg)       Physical Exam Vitals reviewed.  Constitutional:      Appearance: Normal appearance.  HENT:     Head: Normocephalic and atraumatic.     Right Ear: External ear normal.     Left Ear: External ear normal.     Mouth/Throat:     Pharynx: Oropharynx is clear.  Eyes:     General: No scleral icterus.    Conjunctiva/sclera: Conjunctivae normal.  Cardiovascular:     Rate and Rhythm: Normal rate and regular rhythm.     Pulses: Normal pulses.     Heart sounds: Normal heart sounds.  Pulmonary:     Effort: Pulmonary effort is normal.     Breath sounds: Normal breath sounds.  Musculoskeletal:     Right lower leg: No edema.     Left lower leg: No edema.  Skin:    General: Skin is warm and dry.  Neurological:     General: No focal deficit present.     Mental Status: He is alert and oriented to person, place, and time.  Psychiatric:        Mood and Affect: Mood normal.        Behavior: Behavior normal.        Thought Content: Thought content normal.        Judgment: Judgment normal.       No results found for any visits on 10/15/20.  Assessment & Plan     1. Primary hypertension Controlled.  Follow-up for physical.  2. History of testicular cancer    No follow-ups on file.      I, [name], MD, have reviewed all documentation for this visit. The documentation on 10/17/20 for the exam, diagnosis, procedures, and orders are all accurate and complete.    Traeger Sultana Cranford Mon, MD  [facility] [phone] (phone) [phone] (fax)  [place]

## 2020-10-15 NOTE — Patient Instructions (Signed)

## 2020-12-25 ENCOUNTER — Other Ambulatory Visit: Payer: Self-pay | Admitting: Family Medicine

## 2020-12-25 NOTE — Telephone Encounter (Signed)
Requested medication (s) are due for refill today: Yes  Requested medication (s) are on the active medication list: Yes  Last refill:  12/07/19  Future visit scheduled: Yes  Notes to clinic:  Unable to refill per protocol, Rx expired.      Requested Prescriptions  Pending Prescriptions Disp Refills   telmisartan (MICARDIS) 80 MG tablet [Pharmacy Med Name: TELMISARTAN 80MG  TABLETS] 30 tablet 12    Sig: TAKE 1/2 TABLET(40 MG) BY MOUTH DAILY      Cardiovascular:  Angiotensin Receptor Blockers Failed - 12/25/2020  4:26 PM      Failed - Cr in normal range and within 180 days    Creatinine, Ser  Date Value Ref Range Status  06/01/2020 1.01 0.76 - 1.27 mg/dL Final          Failed - K in normal range and within 180 days    Potassium  Date Value Ref Range Status  06/01/2020 4.3 3.5 - 5.2 mmol/L Final          Passed - Patient is not pregnant      Passed - Last BP in normal range    BP Readings from Last 1 Encounters:  10/15/20 120/76          Passed - Valid encounter within last 6 months    Recent Outpatient Visits           2 months ago Primary hypertension   [facility] Jerrol Banana., MD   3 months ago Viral upper respiratory tract infection   [facility] Jerrol Banana., MD   4 months ago Essential hypertension   [facility] Jerrol Banana., MD   6 months ago Annual physical exam   [facility] Jerrol Banana., MD   9 months ago Flank pain   [facility] Jerrol Banana., MD       Future Appointments             In 5 months Jerrol Banana., MD [facility] [place]

## 2021-06-10 ENCOUNTER — Other Ambulatory Visit: Payer: Self-pay

## 2021-06-10 ENCOUNTER — Ambulatory Visit (INDEPENDENT_AMBULATORY_CARE_PROVIDER_SITE_OTHER): Payer: Managed Care, Other (non HMO) | Admitting: Family Medicine

## 2021-06-10 ENCOUNTER — Encounter: Payer: Self-pay | Admitting: Family Medicine

## 2021-06-10 VITALS — BP 138/85 | HR 92 | Temp 97.3°F | Resp 16 | Ht 72.0 in | Wt 250.0 lb

## 2021-06-10 DIAGNOSIS — Z0000 Encounter for general adult medical examination without abnormal findings: Secondary | ICD-10-CM | POA: Diagnosis not present

## 2021-06-10 DIAGNOSIS — Z1211 Encounter for screening for malignant neoplasm of colon: Secondary | ICD-10-CM

## 2021-06-10 DIAGNOSIS — Z1389 Encounter for screening for other disorder: Secondary | ICD-10-CM | POA: Diagnosis not present

## 2021-06-10 DIAGNOSIS — Z1329 Encounter for screening for other suspected endocrine disorder: Secondary | ICD-10-CM | POA: Diagnosis not present

## 2021-06-10 DIAGNOSIS — I10 Essential (primary) hypertension: Secondary | ICD-10-CM | POA: Diagnosis not present

## 2021-06-10 DIAGNOSIS — Z125 Encounter for screening for malignant neoplasm of prostate: Secondary | ICD-10-CM | POA: Diagnosis not present

## 2021-06-10 LAB — POCT URINALYSIS DIPSTICK
Bilirubin, UA: NEGATIVE
Blood, UA: NEGATIVE
Glucose, UA: NEGATIVE
Ketones, UA: NEGATIVE
Leukocytes, UA: NEGATIVE
Nitrite, UA: NEGATIVE
Protein, UA: NEGATIVE
Spec Grav, UA: 1.01 (ref 1.010–1.025)
Urobilinogen, UA: 0.2 E.U./dL
pH, UA: 6 (ref 5.0–8.0)

## 2021-06-10 NOTE — Patient Instructions (Signed)
STOP METOPROLOL. TAKE TELMISARTAN AT NIGHT.

## 2021-06-10 NOTE — Progress Notes (Signed)
I,Peter Ray,acting as a scribe for Peter Durie, MD.,have documented all relevant documentation on the behalf of Peter Durie, MD,as directed by  Peter Durie, MD while in the presence of Peter Durie, MD.   Complete physical exam   Patient: Peter Ray   DOB: 06/29/1971   50 y.o. Male  MRN: [id number] Visit Date: 06/10/2021  Today's healthcare provider: [name], MD   Chief Complaint  Patient presents with   Annual Exam   Subjective    Peter Ray is a 50 y.o. male who presents today for a complete physical exam.  He reports consuming a general diet. The patient has a physically strenuous job, but has no regular exercise apart from work.  He generally feels fairly well. He reports sleeping well. He does not have additional problems to discuss today.  2 of his children getting married in the next couple of months and is excited about that. HPI    Past Medical History:  Diagnosis Date   Allergy    Cancer ([place])    testicular ca. 12 yrs. radiation and surgery   Past Surgical History:  Procedure Laterality Date   TESTICLE REMOVAL Right 2001   secondary to cancer   TONSILLECTOMY     Social History   Socioeconomic History   Marital status: Married    Spouse name: Not on file   Number of children: Not on file   Years of education: Not on file   Highest education level: Not on file  Occupational History   Not on file  Tobacco Use   Smoking status: Never   Smokeless tobacco: Never  Substance and Sexual Activity   Alcohol use: Yes    Alcohol/week: 0.0 standard drinks    Comment: occasional    Drug use: No   Sexual activity: Not on file  Other Topics Concern   Not on file  Social History Narrative   Not on file   Social Determinants of Health   Financial Resource Strain: Not on file  Food Insecurity: Not on file  Transportation Needs: Not on file  Physical Activity: Not on file  Stress: Not on file  Social Connections:  Not on file  Intimate Partner Violence: Not on file   Family Status  Relation Name Status   Mother  Alive   Father  Deceased   Daughter  Alive   Son  Alive   Son  Alive   History reviewed. No pertinent family history. Allergies  Allergen Reactions   Iodinated Diagnostic Agents     Contrast, 12 yrs ago hives, itching, difficulty breathing.    Levofloxacin    Sertraline Hives   Shellfish Allergy    Tree Extract     and grass   Bupropion Rash    Also lips were a little swollen    Patient Care Team: Jerrol Banana., MD as PCP - General (Family Medicine)   Medications: Outpatient Medications Prior to Visit  Medication Sig   fexofenadine (ALLEGRA) 60 MG tablet Take 60 mg by mouth 2 (two) times daily.   fluticasone (FLONASE) 50 MCG/ACT nasal spray Place into both nostrils daily as needed.    telmisartan (MICARDIS) 80 MG tablet TAKE 1/2 TABLET(40 MG) BY MOUTH DAILY   metoprolol succinate (TOPROL-XL) 25 MG 24 hr tablet Take 1 tablet (25 mg total) by mouth daily. (Patient not taking: Reported on 06/10/2021)   No facility-administered medications prior to visit.    Review of Systems  Neurological:  Positive for dizziness and numbness.  All other systems reviewed and are negative.     Objective    BP 138/85 (BP Location: Right Arm, Patient Position: Sitting, Cuff Size: Large)   Pulse 92   Temp (!) 97.3 F (36.3 C) (Temporal)   Resp 16   Ht 6' (1.829 m)   Wt 250 lb (113.4 kg)   SpO2 96%   BMI 33.91 kg/m  BP Readings from Last 3 Encounters:  06/10/21 138/85  10/15/20 120/76  08/10/20 124/86   Wt Readings from Last 3 Encounters:  06/10/21 250 lb (113.4 kg)  10/15/20 246 lb (111.6 kg)  09/16/20 240 lb (108.9 kg)      Physical Exam Vitals reviewed.  Constitutional:      Appearance: Normal appearance.  HENT:     Head: Normocephalic and atraumatic.     Right Ear: External ear normal.     Left Ear: External ear normal.     Mouth/Throat:     Pharynx:  Oropharynx is clear.  Eyes:     General: No scleral icterus.    Conjunctiva/sclera: Conjunctivae normal.  Cardiovascular:     Rate and Rhythm: Normal rate and regular rhythm.     Pulses: Normal pulses.     Heart sounds: Normal heart sounds.  Pulmonary:     Effort: Pulmonary effort is normal.     Breath sounds: Normal breath sounds.  Musculoskeletal:     Right lower leg: No edema.     Left lower leg: No edema.  Skin:    General: Skin is warm and dry.  Neurological:     General: No focal deficit present.     Mental Status: He is alert and oriented to person, place, and time.  Psychiatric:        Mood and Affect: Mood normal.        Behavior: Behavior normal.        Thought Content: Thought content normal.        Judgment: Judgment normal.      Last depression screening scores PHQ 2/9 Scores 06/10/2021 08/10/2020 06/01/2020  PHQ - 2 Score 0 0 1  PHQ- 9 Score 1 1 1    Last fall risk screening Fall Risk  06/10/2021  Falls in the past year? 0  Number falls in past yr: 0  Injury with Fall? 0  Risk for fall due to : No Fall Risks  Follow up Falls evaluation completed   Last Audit-C alcohol use screening Alcohol Use Disorder Test (AUDIT) 06/10/2021  1. How often do you have a drink containing alcohol? 0  2. How many drinks containing alcohol do you have on a typical day when you are drinking? 0  3. How often do you have six or more drinks on one occasion? 0  AUDIT-C Score 0  Alcohol Brief Interventions/Follow-up -   A score of 3 or more in women, and 4 or more in men indicates increased risk for alcohol abuse, EXCEPT if all of the points are from question 1   Results for orders placed or performed in visit on 06/10/21  POCT urinalysis dipstick  Result Value Ref Range   Color, UA Yellow    Clarity, UA Clear    Glucose, UA Negative Negative   Bilirubin, UA Negative    Ketones, UA Negative    Spec Grav, UA 1.010 1.010 - 1.025   Blood, UA Negative    pH, UA 6.0 5.0 - 8.0    Protein,  UA Negative Negative   Urobilinogen, UA 0.2 0.2 or 1.0 E.U./dL   Nitrite, UA Negative    Leukocytes, UA Negative Negative    Assessment & Plan    Routine Health Maintenance and Physical Exam  Exercise Activities and Dietary recommendations  Goals   None     Immunization History  Administered Date(s) Administered   DTaP 08/07/1974   Hepatitis B 06/07/2011, 07/08/2011   IPV 08/07/1974, 06/02/1981   MMR 11/22/1972, 07/08/2011   Moderna Sars-Covid-2 Vaccination 05/04/2020, 06/01/2020   Td 03/31/1978, 06/02/1981, 08/18/1990   Tdap 06/07/2011   Varicella 06/10/2011, 07/08/2011    Health Maintenance  Topic Date Due   COLONOSCOPY (Pts 45-80yr Insurance coverage will need to be confirmed)  Never done   COVID-19 Vaccine (3 - Booster for Moderna series) 11/01/2020   INFLUENZA VACCINE  Never done   TETANUS/TDAP  06/06/2021   Hepatitis C Screening  Completed   HIV Screening  Completed   Pneumococcal Vaccine 03634Years old  Aged Out   HPV VACCINES  Aged Out    Discussed health benefits of physical activity, and encouraged him to engage in regular exercise appropriate for his age and condition.  1. Annual physical exam  - Lipid panel - TSH - CBC w/Diff/Platelet - Comprehensive Metabolic Panel (CMET)  2. Essential hypertension   3. Prostate cancer screening  - PSA  4. Screening for thyroid disorder   5. Screening for hematuria or proteinuria  - POCT urinalysis dipstick--Negative  6. Screening for colon cancer  - Ambulatory referral to Gastroenterology   Return in about 6 months (around 12/08/2021).     I, RWilhemena Durie MD, have reviewed all documentation for this visit. The documentation on 06/13/21 for the exam, diagnosis, procedures, and orders are all accurate and complete.    Ardice Boyan GCranford Mon MD  [phone](phone) [phone](fax)  [place]

## 2021-06-11 LAB — COMPREHENSIVE METABOLIC PANEL
ALT: 16 IU/L (ref 0–44)
AST: 17 IU/L (ref 0–40)
Albumin/Globulin Ratio: 1.9 (ref 1.2–2.2)
Albumin: 4.7 g/dL (ref 4.0–5.0)
Alkaline Phosphatase: 63 IU/L (ref 44–121)
BUN/Creatinine Ratio: 22 — ABNORMAL HIGH (ref 9–20)
BUN: 23 mg/dL (ref 6–24)
Bilirubin Total: 0.4 mg/dL (ref 0.0–1.2)
CO2: 24 mmol/L (ref 20–29)
Calcium: 9.8 mg/dL (ref 8.7–10.2)
Chloride: 102 mmol/L (ref 96–106)
Creatinine, Ser: 1.06 mg/dL (ref 0.76–1.27)
Globulin, Total: 2.5 g/dL (ref 1.5–4.5)
Glucose: 91 mg/dL (ref 65–99)
Potassium: 4.6 mmol/L (ref 3.5–5.2)
Sodium: 137 mmol/L (ref 134–144)
Total Protein: 7.2 g/dL (ref 6.0–8.5)
eGFR: 86 mL/min/{1.73_m2} (ref >59)

## 2021-06-11 LAB — LIPID PANEL
Chol/HDL Ratio: 5.2 ratio — ABNORMAL HIGH (ref 0.0–5.0)
Cholesterol, Total: 222 mg/dL — ABNORMAL HIGH (ref 100–199)
HDL: 43 mg/dL (ref >39)
LDL Chol Calc (NIH): 148 mg/dL — ABNORMAL HIGH (ref 0–99)
Triglycerides: 171 mg/dL — ABNORMAL HIGH (ref 0–149)
VLDL Cholesterol Cal: 31 mg/dL (ref 5–40)

## 2021-06-11 LAB — CBC WITH DIFFERENTIAL/PLATELET
Basophils Absolute: 0.1 10*3/uL (ref 0.0–0.2)
Basos: 1 %
EOS (ABSOLUTE): 0.2 10*3/uL (ref 0.0–0.4)
Eos: 3 %
Hematocrit: 43.3 % (ref 37.5–51.0)
Hemoglobin: 15 g/dL (ref 13.0–17.7)
Immature Grans (Abs): 0 10*3/uL (ref 0.0–0.1)
Immature Granulocytes: 0 %
Lymphocytes Absolute: 1.5 10*3/uL (ref 0.7–3.1)
Lymphs: 21 %
MCH: 31.1 pg (ref 26.6–33.0)
MCHC: 34.6 g/dL (ref 31.5–35.7)
MCV: 90 fL (ref 79–97)
Monocytes Absolute: 0.4 10*3/uL (ref 0.1–0.9)
Monocytes: 6 %
Neutrophils Absolute: 4.8 10*3/uL (ref 1.4–7.0)
Neutrophils: 69 %
Platelets: 233 10*3/uL (ref 150–450)
RBC: 4.82 x10E6/uL (ref 4.14–5.80)
RDW: 12.1 % (ref 11.6–15.4)
WBC: 7 10*3/uL (ref 3.4–10.8)

## 2021-06-11 LAB — PSA: Prostate Specific Ag, Serum: 0.4 ng/mL (ref 0.0–4.0)

## 2021-06-11 LAB — TSH: TSH: 1.49 u[IU]/mL (ref 0.450–4.500)

## 2021-06-16 ENCOUNTER — Other Ambulatory Visit: Payer: Self-pay

## 2021-06-16 NOTE — Progress Notes (Signed)
Unable to contact to schedule cc screening. Letter will be sent out.

## 2021-06-17 ENCOUNTER — Telehealth: Payer: Self-pay

## 2021-06-17 NOTE — Telephone Encounter (Signed)
Returned patients call. LVM to call office back. 

## 2021-06-18 ENCOUNTER — Telehealth: Payer: Self-pay

## 2021-06-18 NOTE — Telephone Encounter (Signed)
Returned patients call. LVM to call office back. 

## 2021-10-05 ENCOUNTER — Other Ambulatory Visit: Payer: Self-pay

## 2021-10-05 DIAGNOSIS — Z800 Family history of malignant neoplasm of digestive organs: Secondary | ICD-10-CM

## 2021-10-05 DIAGNOSIS — Z1211 Encounter for screening for malignant neoplasm of colon: Secondary | ICD-10-CM

## 2021-10-05 MED ORDER — NA SULFATE-K SULFATE-MG SULF 17.5-3.13-1.6 GM/177ML PO SOLN: 1.0000 | Freq: Once | ORAL | 0 refills | Status: AC

## 2021-10-05 NOTE — Progress Notes (Signed)
Gastroenterology Pre-Procedure Review  Request Date: 10/22/2021 Requesting Physician: Dr. Allen Norris  PATIENT REVIEW QUESTIONS: The patient responded to the following health history questions as indicated:    1. Are you having any GI issues? no 2. Do you have a personal history of Polyps? no 3. Do you have a family history of Colon Cancer or Polyps? yes (Paternal Grandfather- colon cancer; Maternal Uncle- colon cancer) 4. Diabetes Mellitus? no 5. Joint replacements in the past 12 months?no 6. Major health problems in the past 3 months?no 7. Any artificial heart valves, MVP, or defibrillator?no    MEDICATIONS & ALLERGIES:    Patient reports the following regarding taking any anticoagulation/antiplatelet therapy:   Plavix, Coumadin, Eliquis, Xarelto, Lovenox, Pradaxa, Brilinta, or Effient? no Aspirin? no  Patient confirms/reports the following medications:  Current Outpatient Medications  Medication Sig Dispense Refill   fexofenadine (ALLEGRA) 60 MG tablet Take 60 mg by mouth 2 (two) times daily.     fluticasone (FLONASE) 50 MCG/ACT nasal spray Place into both nostrils daily as needed.      metoprolol succinate (TOPROL-XL) 25 MG 24 hr tablet Take 1 tablet (25 mg total) by mouth daily. (Patient not taking: Reported on 06/10/2021) 30 tablet 5   telmisartan (MICARDIS) 80 MG tablet TAKE 1/2 TABLET(40 MG) BY MOUTH DAILY 30 tablet 12   No current facility-administered medications for this visit.    Patient confirms/reports the following allergies:  Allergies  Allergen Reactions   Iodinated Contrast Media     Contrast, 12 yrs ago hives, itching, difficulty breathing.    Levofloxacin    Sertraline Hives   Shellfish Allergy    Tree Extract     and grass   Bupropion Rash    Also lips were a little swollen    No orders of the defined types were placed in this encounter.   AUTHORIZATION INFORMATION Primary Insurance: 1D#: Group #:  Secondary Insurance: 1D#: Group #:  SCHEDULE  INFORMATION: Date: 10/22/2021 Time: Location: [place]

## 2021-10-06 ENCOUNTER — Encounter: Payer: Self-pay | Admitting: Gastroenterology

## 2021-10-22 ENCOUNTER — Encounter
Admission: RE | Disposition: A | Discharge status: Home / Self Care | Payer: Self-pay | Source: Home / Self Care | Attending: Gastroenterology

## 2021-10-22 ENCOUNTER — Other Ambulatory Visit: Payer: Self-pay

## 2021-10-22 ENCOUNTER — Encounter: Payer: Self-pay | Admitting: Gastroenterology

## 2021-10-22 ENCOUNTER — Ambulatory Visit
Admission: RE | Admit: 2021-10-22 | Discharge: 2021-10-22 | Disposition: A | Discharge status: Home / Self Care | Payer: Managed Care, Other (non HMO) | Attending: Gastroenterology | Admitting: Gastroenterology

## 2021-10-22 ENCOUNTER — Ambulatory Visit: Payer: Managed Care, Other (non HMO) | Admitting: Anesthesiology

## 2021-10-22 DIAGNOSIS — K640 First degree hemorrhoids: Secondary | ICD-10-CM | POA: Insufficient documentation

## 2021-10-22 DIAGNOSIS — D122 Benign neoplasm of ascending colon: Secondary | ICD-10-CM | POA: Insufficient documentation

## 2021-10-22 DIAGNOSIS — Z800 Family history of malignant neoplasm of digestive organs: Secondary | ICD-10-CM

## 2021-10-22 DIAGNOSIS — I10 Essential (primary) hypertension: Secondary | ICD-10-CM | POA: Insufficient documentation

## 2021-10-22 DIAGNOSIS — K635 Polyp of colon: Secondary | ICD-10-CM

## 2021-10-22 DIAGNOSIS — Z1211 Encounter for screening for malignant neoplasm of colon: Secondary | ICD-10-CM | POA: Diagnosis present

## 2021-10-22 HISTORY — PX: POLYPECTOMY: SHX5525

## 2021-10-22 HISTORY — PX: COLONOSCOPY WITH PROPOFOL: SHX5780

## 2021-10-22 HISTORY — DX: Essential (primary) hypertension: I10

## 2021-10-22 SURGERY — COLONOSCOPY WITH PROPOFOL
Anesthesia: General | Site: Rectum

## 2021-10-22 MED ORDER — PROPOFOL 10 MG/ML IV BOLUS
INTRAVENOUS | Status: DC | PRN
Start: 2021-10-22 — End: 2021-10-22
  Administered 2021-10-22: 50 mg via INTRAVENOUS
  Administered 2021-10-22 (×2): 80 mg via INTRAVENOUS

## 2021-10-22 MED ORDER — STERILE WATER FOR IRRIGATION IR SOLN
Status: DC | PRN
  Administered 2021-10-22: 1

## 2021-10-22 MED ORDER — LIDOCAINE HCL (CARDIAC) PF 100 MG/5ML IV SOSY
PREFILLED_SYRINGE | INTRAVENOUS | Status: DC | PRN
  Administered 2021-10-22: 40 mg via INTRAVENOUS

## 2021-10-22 MED ORDER — SODIUM CHLORIDE 0.9 % IV SOLN: INTRAVENOUS | Status: DC

## 2021-10-22 MED ORDER — PHENYLEPHRINE HCL (PRESSORS) 10 MG/ML IV SOLN
INTRAVENOUS | Status: DC | PRN
  Administered 2021-10-22: 100 ug via INTRAVENOUS

## 2021-10-22 MED ORDER — GLYCOPYRROLATE 0.2 MG/ML IJ SOLN
INTRAMUSCULAR | Status: DC | PRN
  Administered 2021-10-22: .2 mg via INTRAVENOUS

## 2021-10-22 MED ORDER — LACTATED RINGERS IV SOLN: INTRAVENOUS | Status: DC

## 2021-10-22 SURGICAL SUPPLY — 22 items

## 2021-10-22 NOTE — Transfer of Care (Signed)
Immediate Anesthesia Transfer of Care Note  Patient: Peter Ray  Procedure(s) Performed: COLONOSCOPY WITH PROPOFOL (Rectum) POLYPECTOMY (Rectum)  Patient Location: PACU  Anesthesia Type: General  Level of Consciousness: awake, alert  and patient cooperative  Airway and Oxygen Therapy: Patient Spontanous Breathing and Patient connected to supplemental oxygen  Post-op Assessment: Post-op Vital signs reviewed, Patient's Cardiovascular Status Stable, Respiratory Function Stable, Patent Airway and No signs of Nausea or vomiting  Post-op Vital Signs: Reviewed and stable  Complications: No notable events documented.

## 2021-10-22 NOTE — Anesthesia Postprocedure Evaluation (Signed)
Anesthesia Post Note  Patient: Peter Ray  Procedure(s) Performed: COLONOSCOPY WITH PROPOFOL (Rectum) POLYPECTOMY (Rectum)     Patient location during evaluation: PACU Anesthesia Type: General Level of consciousness: awake and alert Pain management: pain level controlled Vital Signs Assessment: post-procedure vital signs reviewed and stable Respiratory status: spontaneous breathing, nonlabored ventilation and respiratory function stable Cardiovascular status: blood pressure returned to baseline and stable Postop Assessment: no apparent nausea or vomiting Anesthetic complications: no   No notable events documented.  Wanda Plump Ashur Glatfelter

## 2021-10-22 NOTE — H&P (Signed)
° °  Lucilla Lame, MD [address] [place], [place] [address] Phone: [phone] Fax : [phone]  Primary Care Physician:  Jerrol Banana., MD Primary Gastroenterologist:  Dr. Allen Norris  Pre-Procedure History & Physical: HPI:  Peter Ray is a 51 y.o. male is here for a screening colonoscopy.   Past Medical History:  Diagnosis Date   Allergy    Cancer ([place])    testicular ca. 12 yrs. radiation and surgery   Hypertension     Past Surgical History:  Procedure Laterality Date   TESTICLE REMOVAL Right 2001   secondary to cancer   TONSILLECTOMY      Prior to Admission medications   Medication Sig Start Date End Date Taking? Authorizing Provider  fexofenadine (ALLEGRA) 60 MG tablet Take 60 mg by mouth 2 (two) times daily.   Yes [provider]  fluticasone (FLONASE) 50 MCG/ACT nasal spray Place into both nostrils daily as needed.    Yes [provider]  Multiple Vitamins-Minerals (EMERGEN-C IMMUNE PO) Take by mouth daily as needed.   Yes [provider]  telmisartan (MICARDIS) 80 MG tablet TAKE 1/2 TABLET(40 MG) BY MOUTH DAILY 12/28/20  Yes Jerrol Banana., MD    Allergies as of 10/05/2021 - Review Complete 06/10/2021  Allergen Reaction Noted   Iodinated contrast media  03/20/2015   Levofloxacin  03/20/2015   Sertraline Hives 09/09/2015   Shellfish allergy  03/20/2015   Tree extract  03/20/2015   Bupropion Rash 09/14/2015    History reviewed. No pertinent family history.  Social History   Socioeconomic History   Marital status: Married    Spouse name: Not on file   Number of children: Not on file   Years of education: Not on file   Highest education level: Not on file  Occupational History   Not on file  Tobacco Use   Smoking status: Never   Smokeless tobacco: Never  Vaping Use   Vaping Use: Never used  Substance and Sexual Activity   Alcohol use: Yes    Alcohol/week: 0.0 standard drinks    Comment:  occasional    Drug use: No   Sexual activity: Not on file  Other Topics Concern   Not on file  Social History Narrative   Not on file   Social Determinants of Health   Financial Resource Strain: Not on file  Food Insecurity: Not on file  Transportation Needs: Not on file  Physical Activity: Not on file  Stress: Not on file  Social Connections: Not on file  Intimate Partner Violence: Not on file    Review of Systems: See HPI, otherwise negative ROS  Physical Exam: BP 122/74    Pulse 91    Temp 97.7 F (36.5 C) (Temporal)    Ht 6' (1.829 m)    Wt 110.2 kg    SpO2 99%    BMI 32.94 kg/m  General:   Alert,  pleasant and cooperative in NAD Head:  Normocephalic and atraumatic. Neck:  Supple; no masses or thyromegaly. Lungs:  Clear throughout to auscultation.    Heart:  Regular rate and rhythm. Abdomen:  Soft, nontender and nondistended. Normal bowel sounds, without guarding, and without rebound.   Neurologic:  Alert and  oriented x4;  grossly normal neurologically.  Impression/Plan: Peter Ray is now here to undergo a screening colonoscopy.  Risks, benefits, and alternatives regarding colonoscopy have been reviewed with the patient.  Questions have been answered.  All parties agreeable.

## 2021-10-22 NOTE — Op Note (Signed)
[facility] Gastroenterology Patient Name: Peter Ray Procedure Date: 10/22/2021 9:10 AM MRN: [id number] Account #: [id number] Date of Birth: May 26, 1971 Admit Type: Outpatient Age: 51 Room: [facility] OR ROOM 01 Gender: Male Note Status: Finalized Instrument Name: [id number] Procedure:             Colonoscopy Indications:           Screening for colorectal malignant neoplasm Providers:             Lucilla Lame MD, MD Referring MD:          Janine Ores. Rosanna Randy, MD (Referring MD) Medicines:             Propofol per Anesthesia Complications:         No immediate complications. Procedure:             Pre-Anesthesia Assessment:                        - Prior to the procedure, a History and Physical was                         performed, and patient medications and allergies were                         reviewed. The patient's tolerance of previous                         anesthesia was also reviewed. The risks and benefits                         of the procedure and the sedation options and risks                         were discussed with the patient. All questions were                         answered, and informed consent was obtained. Prior                         Anticoagulants: The patient has taken no previous                         anticoagulant or antiplatelet agents. ASA Grade                         Assessment: II - A patient with mild systemic disease.                         After reviewing the risks and benefits, the patient                         was deemed in satisfactory condition to undergo the                         procedure.                        After obtaining informed consent, the colonoscope was  passed under direct vision. Throughout the procedure,                         the patient's blood pressure, pulse, and oxygen                         saturations were monitored continuously. The                         Colonoscope  was introduced through the anus and                         advanced to the the cecum, identified by appendiceal                         orifice and ileocecal valve. The colonoscopy was                         performed without difficulty. The patient tolerated                         the procedure well. The quality of the bowel                         preparation was excellent. Findings:      The perianal and digital rectal examinations were normal.      A 7 mm polyp was found in the ascending colon. The polyp was sessile.       The polyp was removed with a cold snare. Resection and retrieval were       complete.      A 3 mm polyp was found in the sigmoid colon. The polyp was sessile. The       polyp was removed with a cold snare. Resection and retrieval were       complete.      Non-bleeding internal hemorrhoids were found during retroflexion. The       hemorrhoids were Grade I (internal hemorrhoids that do not prolapse). Impression:            - One 7 mm polyp in the ascending colon, removed with                         a cold snare. Resected and retrieved.                        - One 3 mm polyp in the sigmoid colon, removed with a                         cold snare. Resected and retrieved.                        - Non-bleeding internal hemorrhoids. Recommendation:        - Discharge patient to home.                        - Resume previous diet.                        - Continue present medications.                        -  Await pathology results.                        - If the pathology report reveals adenomatous tissue,                         then repeat the colonoscopy for surveillance in 7                         years. Procedure Code(s):     --- Professional ---                        [phone], Colonoscopy, flexible; with removal of                         tumor(s), polyp(s), or other lesion(s) by snare                         technique Diagnosis Code(s):     ---  Professional ---                        Z12.11, Encounter for screening for malignant neoplasm                         of colon                        K63.5, Polyp of colon CPT copyright 2019 American Medical Association. All rights reserved. The codes documented in this report are preliminary and upon coder review may  be revised to meet current compliance requirements. Lucilla Lame MD, MD 10/22/2021 9:33:13 AM This report has been signed electronically. Number of Addenda: 0 Note Initiated On: 10/22/2021 9:10 AM Scope Withdrawal Time: 0 hours 7 minutes 3 seconds  Total Procedure Duration: 0 hours 12 minutes 21 seconds  Estimated Blood Loss:  Estimated blood loss: none.      [facility]

## 2021-10-22 NOTE — Anesthesia Preprocedure Evaluation (Signed)
Anesthesia Evaluation  Patient identified by MRN, date of birth, ID band  Airway Mallampati: II       Dental   Pulmonary neg pulmonary ROS,    Pulmonary exam normal        Cardiovascular hypertension, Normal cardiovascular exam     Neuro/Psych    GI/Hepatic Bowel prep,  Endo/Other    Renal/GU      Musculoskeletal   Abdominal   Peds  Hematology   Anesthesia Other Findings   Reproductive/Obstetrics                             Anesthesia Physical Anesthesia Plan  ASA: 2  Anesthesia Plan: General   Post-op Pain Management:    Induction:   PONV Risk Score and Plan:   Airway Management Planned: Natural Airway  Additional Equipment:   Intra-op Plan:   Post-operative Plan:   Informed Consent: I have reviewed the patients History and Physical, chart, labs and discussed the procedure including the risks, benefits and alternatives for the proposed anesthesia with the patient or authorized representative who has indicated his/her understanding and acceptance.       Plan Discussed with: CRNA and Anesthesiologist  Anesthesia Plan Comments:         Anesthesia Quick Evaluation

## 2021-10-25 ENCOUNTER — Encounter: Payer: Self-pay | Admitting: Gastroenterology

## 2021-10-25 LAB — SURGICAL PATHOLOGY

## 2021-10-26 ENCOUNTER — Encounter: Payer: Self-pay | Admitting: Gastroenterology

## 2021-11-05 ENCOUNTER — Ambulatory Visit: Payer: Self-pay

## 2021-11-05 ENCOUNTER — Other Ambulatory Visit: Payer: Self-pay

## 2021-11-05 ENCOUNTER — Telehealth: Payer: Self-pay

## 2021-11-05 MED ORDER — PAXLOVID (300/100) 20 X 150 MG & 10 X 100MG PO TBPK
ORAL_TABLET | ORAL | 30 refills | Status: DC
  Filled 2021-11-05: qty 30, 5d supply, fill #0

## 2021-11-05 NOTE — Telephone Encounter (Signed)
Outpatient Pharmacy Oral COVID Treatment Note  I connected with Peter Ray on 11/05/2021/3:33 PM by telephone and verified that I am speaking with the correct person using two identifiers.  I discussed the limitations, risks, security, and privacy concerns of performing an evaluation and management service by telephone and the availability of in person appointments via referral to a physician. The patient expressed understanding and agreed to proceed.  Pharmacy location: [place]  Diagnosis: COVID-19 infection  Purpose of visit: Discussion of potential use of Paxlovid, a new treatment for mild to moderate COVID-19 viral infection in non-hospitalized patients.  Subjective/Objective: Patient is a 51 y.o. male who is presenting with COVID 19 viral infection.  COVID 19 viral infection. Their symptoms began on 11/04/21 with congestion, cough, chills, fever, body aches.  The patient has confirmed COVID-19 via a home test on 11/05/20.   Past Medical History:  Diagnosis Date   Allergy    Cancer ([place])    testicular ca. 12 yrs. radiation and surgery   Hypertension      Allergies  Allergen Reactions   Iodinated Contrast Media     Contrast, 12 yrs ago hives, itching, difficulty breathing.    Levofloxacin Itching    Redness   Sertraline Hives   Shellfish Allergy    Tree Extract     and grass   Bupropion Rash    Also lips were a little swollen     Current Outpatient Medications:    fexofenadine (ALLEGRA) 60 MG tablet, Take 60 mg by mouth 2 (two) times daily., Disp: , Rfl:    fluticasone (FLONASE) 50 MCG/ACT nasal spray, Place into both nostrils daily as needed. , Disp: , Rfl:    Multiple Vitamins-Minerals (EMERGEN-C IMMUNE PO), Take by mouth daily as needed., Disp: , Rfl:    nirmatrelvir & ritonavir (PAXLOVID, 300/100,) 20 x 150 MG & 10 x 100MG TBPK, Take 3 tablets ( 2 nirmatrelvir and 1 ritonavir) by mouth twice daily., Disp: 30 tablet, Rfl: 30   telmisartan  (MICARDIS) 80 MG tablet, TAKE 1/2 TABLET(40 MG) BY MOUTH DAILY, Disp: 30 tablet, Rfl: 12 No current facility-administered medications for this visit.  Lab Monitoring: eGFR 86  Drug Interactions Noted: Fluticasone - instructed to hold while on Paxlovid.  Plan:  This patient is a 50 y.o. male that meets the criteria for Emergency Use Authorization of Paxlovid. After reviewing the emergency use authorization with the patient, the patient agrees to receive Paxlovid.  Through FDA guidance and current [place] standing order Paxlovid will be prescribed to the patient.   Patient contacted for counseling on 11/05/21 and verbalized understanding.   Delivery or Pick-Up Date: 11/05/21  Follow up instructions:    Take prescription BID x 5 days as directed Counseling was provided by pharmacist. Reach out to pharmacist with follow up questions For concerns regarding further COVID symptoms please follow up with your PCP or urgent care For urgent or life-threatening issues, seek care at your local emergency department   [facility] 11/05/2021, 3:33 PM [place] Pharmacist Phone# [phone]

## 2021-11-05 NOTE — Telephone Encounter (Signed)
Patient called in to say that he tested positive for Covid symptoms include fever chills body ache congestion and cough. He is requesting an Rx for medication. Please advise and call  Ph# [phone]     Chief Complaint: COVID 19 positive - home test today Symptoms: Cough, congestion, fever, chills, body aches Frequency: Started yesterday Pertinent Negatives: Patient denies SOB Disposition: [] ED /[] Urgent Care (no appt availability in office) / [] Appointment(In office/virtual)/ []  [facility]/ [] Home Care/ [] Refused Recommended Disposition /[] [facility]/ []  Follow-up with PCP Additional Notes: Pt. Will contact [facility] for anti-viral. No availability in practice today.  Answer Assessment - Initial Assessment Questions 1. COVID-19 DIAGNOSIS: "Who made your COVID-19 diagnosis?" "Was it confirmed by a positive lab test or self-test?" If not diagnosed by a doctor (or NP/PA), ask "Are there lots of cases (community spread) where you live?" Note: See public health department website, if unsure.     Home test 2. COVID-19 EXPOSURE: "Was there any known exposure to COVID before the symptoms began?" CDC Definition of close contact: within 6 feet (2 meters) for a total of 15 minutes or more over a 24-hour period.      No 3. ONSET: "When did the COVID-19 symptoms start?"      Yesterday 4. WORST SYMPTOM: "What is your worst symptom?" (e.g., cough, fever, shortness of breath, muscle aches)     Fever 5. COUGH: "Do you have a cough?" If Yes, ask: "How bad is the cough?"       Yes 6. FEVER: "Do you have a fever?" If Yes, ask: "What is your temperature, how was it measured, and when did it start?"     Yes-  7. RESPIRATORY STATUS: "Describe your breathing?" (e.g., shortness of breath, wheezing, unable to speak)      No 8. BETTER-SAME-WORSE: "Are you getting better, staying the same or getting worse compared to yesterday?"  If getting worse, ask, "In what way?"      Worse 9. HIGH RISK DISEASE: "Do you have any chronic medical problems?" (e.g., asthma, heart or lung disease, weak immune system, obesity, etc.)     HTN 10. VACCINE: "Have you had the COVID-19 vaccine?" If Yes, ask: "Which one, how many shots, when did you get it?"       N/A 11. BOOSTER: "Have you received your COVID-19 booster?" If Yes, ask: "Which one and when did you get it?"       N/A 12. PREGNANCY: "Is there any chance you are pregnant?" "When was your last menstrual period?"       N/A 13. OTHER SYMPTOMS: "Do you have any other symptoms?"  (e.g., chills, fatigue, headache, loss of smell or taste, muscle pain, sore throat)       Chills, body aches, congestion 14. O2 SATURATION MONITOR:  "Do you use an oxygen saturation monitor (pulse oximeter) at home?" If Yes, ask "What is your reading (oxygen level) today?" "What is your usual oxygen saturation reading?" (e.g., 95%)       No  Protocols used: Coronavirus (COVID-19) Diagnosed or Suspected-A-AH

## 2021-11-18 ENCOUNTER — Other Ambulatory Visit: Payer: Self-pay

## 2021-12-07 NOTE — Progress Notes (Signed)
? ?I,Elena D DeSanto,acting as a scribe for [name], MD.,have documented all relevant documentation on the behalf of [name], MD,as directed by  [name], MD while in the presence of [name], MD. ?  ? ? ?Established patient visit ? ? ?Patient: Peter Ray   DOB: Jun 19, 1971   51 y.o. Male  MRN: [id number] ?Visit Date: 12/08/2021 ? ?Today's healthcare provider: [name], MD  ? ?No chief complaint on file. ? ?Subjective  ?  ?HPI  ?And actually feels well and has no complaints today.  Emotionally he is doing well and his stress level is better.  He is enjoying his work more as they are not taking his input ?Hypertension, follow-up ? ?BP Readings from Last 3 Encounters:  ?12/08/21 125/75  ?10/22/21 100/60  ?06/10/21 138/85  ? Wt Readings from Last 3 Encounters:  ?12/08/21 258 lb (117 kg)  ?10/22/21 242 lb 14.4 oz (110.2 kg)  ?06/10/21 250 lb (113.4 kg)  ?  ? ?He was last seen for hypertension 6 months ago.  ?BP at that visit was as above. Management since that visit includes none. ? ?He reports good compliance with treatment. ?He is not having side effects.  ? ?Use of agents associated with hypertension: none.  ? ?Outside blood pressures are not being checked. ?Symptoms: ?No chest pain No chest pressure  ?No palpitations No syncope  ?No dyspnea No orthopnea  ?No paroxysmal nocturnal dyspnea No lower extremity edema  ? ?Pertinent labs: ?Lab Results  ?Component Value Date  ? CHOL 222 (H) 06/10/2021  ? HDL 43 06/10/2021  ? LDLCALC 148 (H) 06/10/2021  ? TRIG 171 (H) 06/10/2021  ? CHOLHDL 5.2 (H) 06/10/2021  ? Lab Results  ?Component Value Date  ? NA 137 06/10/2021  ? K 4.6 06/10/2021  ? CREATININE 1.06 06/10/2021  ? EGFR 86 06/10/2021  ? GLUCOSE 91 06/10/2021  ? TSH 1.490 06/10/2021  ?  ? ?The 10-year ASCVD risk score (Arnett DK, et al., 2019) is: 5.3%  ? ?--------------------------------------------------------------------------------------------------- ?Patient does  complain of some post covid lung pain with deep breaths. No shortness of breath. ? ?Medications: ?Outpatient Medications Prior to Visit  ?Medication Sig  ? fexofenadine (ALLEGRA) 60 MG tablet Take 60 mg by mouth 2 (two) times daily.  ? fluticasone (FLONASE) 50 MCG/ACT nasal spray Place into both nostrils daily as needed.   ? Multiple Vitamins-Minerals (EMERGEN-C IMMUNE PO) Take by mouth daily as needed.  ? nirmatrelvir & ritonavir (PAXLOVID, 300/100,) 20 x 150 MG & 10 x 100MG TBPK Take 3 tablets ( 2 nirmatrelvir and 1 ritonavir) by mouth twice daily.  ? telmisartan (MICARDIS) 80 MG tablet TAKE 1/2 TABLET(40 MG) BY MOUTH DAILY  ? ?No facility-administered medications prior to visit.  ? ? ?Review of Systems ? ?  ?  Objective  ?  ?BP 125/75 (BP Location: Right Arm, Patient Position: Sitting, Cuff Size: Large)   Pulse 73   Temp 98.3 ?F (36.8 ?C) (Oral)   Wt 258 lb (117 kg)   SpO2 99%   BMI 34.99 kg/m?  ?  ? ?Physical Exam ?Vitals reviewed.  ?Constitutional:   ?   Appearance: Normal appearance.  ?HENT:  ?   Head: Normocephalic and atraumatic.  ?   Right Ear: External ear normal.  ?   Left Ear: External ear normal.  ?   Mouth/Throat:  ?   Pharynx: Oropharynx is clear.  ?Eyes:  ?   General: No scleral icterus. ?  Conjunctiva/sclera: Conjunctivae normal.  ?Cardiovascular:  ?   Rate and Rhythm: Normal rate and regular rhythm.  ?   Pulses: Normal pulses.  ?   Heart sounds: Normal heart sounds.  ?Pulmonary:  ?   Effort: Pulmonary effort is normal.  ?   Breath sounds: Normal breath sounds.  ?Musculoskeletal:  ?   Right lower leg: No edema.  ?   Left lower leg: No edema.  ?Skin: ?   General: Skin is warm and dry.  ?Neurological:  ?   General: No focal deficit present.  ?   Mental Status: He is alert and oriented to person, place, and time.  ?Psychiatric:     ?   Mood and Affect: Mood normal.     ?   Behavior: Behavior normal.     ?   Thought Content: Thought content normal.     ?   Judgment: Judgment normal.  ?  ? ? ?No  results found for any visits on 12/08/21. ? Assessment & Plan  ?  ? ?1. Primary hypertension ?On telmisartan 80 ? ?2. Seasonal allergic rhinitis due to other allergic trigger ?Reasonable control ? ?3. History of testicular cancer ?Open the fall. ? ? ?No follow-ups on file.  ?   ? ?I, [name], MD, have reviewed all documentation for this visit. The documentation on 12/10/21 for the exam, diagnosis, procedures, and orders are all accurate and complete. ? ? ? ?Ferdie Bakken Cranford Mon, MD  ?[facility] ?[phone] (phone) ?[phone] (fax) ? ?[facility] ?

## 2021-12-08 ENCOUNTER — Ambulatory Visit: Payer: Managed Care, Other (non HMO) | Admitting: Family Medicine

## 2021-12-08 ENCOUNTER — Other Ambulatory Visit: Payer: Self-pay

## 2021-12-08 ENCOUNTER — Encounter: Payer: Self-pay | Admitting: Family Medicine

## 2021-12-08 VITALS — BP 125/75 | HR 73 | Temp 98.3°F | Wt 258.0 lb

## 2021-12-08 DIAGNOSIS — I10 Essential (primary) hypertension: Secondary | ICD-10-CM | POA: Diagnosis not present

## 2021-12-08 DIAGNOSIS — J3089 Other allergic rhinitis: Secondary | ICD-10-CM | POA: Diagnosis not present

## 2021-12-08 DIAGNOSIS — Z8547 Personal history of malignant neoplasm of testis: Secondary | ICD-10-CM | POA: Diagnosis not present

## 2022-01-10 ENCOUNTER — Other Ambulatory Visit: Payer: Self-pay | Admitting: Family Medicine

## 2022-01-10 DIAGNOSIS — I10 Essential (primary) hypertension: Secondary | ICD-10-CM

## 2022-03-23 ENCOUNTER — Ambulatory Visit
Admission: RE | Admit: 2022-03-23 | Discharge: 2022-03-23 | Disposition: A | Discharge status: Home / Self Care | Payer: Managed Care, Other (non HMO) | Source: Ambulatory Visit | Attending: Family Medicine | Admitting: Family Medicine

## 2022-03-23 ENCOUNTER — Encounter: Payer: Self-pay | Admitting: Family Medicine

## 2022-03-23 ENCOUNTER — Ambulatory Visit: Payer: Managed Care, Other (non HMO) | Admitting: Family Medicine

## 2022-03-23 ENCOUNTER — Ambulatory Visit
Admission: RE | Admit: 2022-03-23 | Discharge: 2022-03-23 | Disposition: A | Discharge status: Home / Self Care | Payer: Managed Care, Other (non HMO) | Attending: Family Medicine | Admitting: Family Medicine

## 2022-03-23 VITALS — BP 129/68 | HR 79 | Temp 98.1°F | Resp 14 | Wt 261.0 lb

## 2022-03-23 DIAGNOSIS — R091 Pleurisy: Secondary | ICD-10-CM | POA: Diagnosis present

## 2022-03-23 DIAGNOSIS — R053 Chronic cough: Secondary | ICD-10-CM | POA: Insufficient documentation

## 2022-03-23 DIAGNOSIS — I10 Essential (primary) hypertension: Secondary | ICD-10-CM

## 2022-03-23 DIAGNOSIS — Z8547 Personal history of malignant neoplasm of testis: Secondary | ICD-10-CM | POA: Diagnosis not present

## 2022-03-23 DIAGNOSIS — R109 Unspecified abdominal pain: Secondary | ICD-10-CM

## 2022-03-23 DIAGNOSIS — G8929 Other chronic pain: Secondary | ICD-10-CM

## 2022-03-23 DIAGNOSIS — J3089 Other allergic rhinitis: Secondary | ICD-10-CM

## 2022-03-23 MED ORDER — PREDNISONE 20 MG PO TABS: 20.0000 mg | ORAL_TABLET | Freq: Every day | ORAL | 1 refills | Status: DC

## 2022-03-23 NOTE — Progress Notes (Signed)
I,Roshena L Chambers,acting as a scribe for [name], MD.,have documented all relevant documentation on the behalf of [name], MD,as directed by  [name], MD while in the presence of [name], MD.   Established patient visit   Patient: Peter Ray   DOB: May 17, 1971   51 y.o. Male  MRN: [id number] Visit Date: 03/23/2022  Today's healthcare provider: [name], MD   Chief Complaint  Patient presents with   painful breathing   Subjective    HPI  Overall patient is doing fairly well but has a couple of complaints. No GI or GU symptoms and no cough or shortness of breath or chest pain Painful deep breathing: Patient complains of discomfort on his right side when taking a deep breath. Symptoms started 4 months ago after COVID infection. He denies any shortness of breath. He also reports a dull achy pain in the lower left side of his back.   Medications: Outpatient Medications Prior to Visit  Medication Sig   fexofenadine (ALLEGRA) 60 MG tablet Take 60 mg by mouth 2 (two) times daily.   fluticasone (FLONASE) 50 MCG/ACT nasal spray Place into both nostrils daily as needed.    Multiple Vitamins-Minerals (EMERGEN-C IMMUNE PO) Take by mouth daily as needed.   telmisartan (MICARDIS) 80 MG tablet TAKE 1/2 TABLET(40 MG) BY MOUTH DAILY   [DISCONTINUED] nirmatrelvir & ritonavir (PAXLOVID, 300/100,) 20 x 150 MG & 10 x '100MG'$  TBPK Take 3 tablets ( 2 nirmatrelvir and 1 ritonavir) by mouth twice daily.   No facility-administered medications prior to visit.    Review of Systems  Constitutional:  Negative for appetite change, chills and fever.  Respiratory:  Negative for chest tightness, shortness of breath and wheezing.        Painful deep breathing  Cardiovascular:  Negative for chest pain and palpitations.  Gastrointestinal:  Negative for abdominal pain, nausea and vomiting.  Musculoskeletal:  Positive for back pain.    Last lipids Lab  Results  Component Value Date   CHOL 222 (H) 06/10/2021   HDL 43 06/10/2021   LDLCALC 148 (H) 06/10/2021   TRIG 171 (H) 06/10/2021   CHOLHDL 5.2 (H) 06/10/2021       Objective    BP 129/68 (BP Location: Left Arm, Patient Position: Sitting, Cuff Size: Large)   Pulse 79   Temp 98.1 F (36.7 C) (Oral)   Resp 14   Wt 261 lb (118.4 kg)   SpO2 99% Comment: room air  BMI 35.40 kg/m  BP Readings from Last 3 Encounters:  03/23/22 129/68  12/08/21 125/75  10/22/21 100/60   Wt Readings from Last 3 Encounters:  03/23/22 261 lb (118.4 kg)  12/08/21 258 lb (117 kg)  10/22/21 242 lb 14.4 oz (110.2 kg)      Physical Exam Vitals reviewed.  Constitutional:      Appearance: Normal appearance.  HENT:     Head: Normocephalic and atraumatic.     Right Ear: External ear normal.     Left Ear: External ear normal.     Mouth/Throat:     Pharynx: Oropharynx is clear.  Eyes:     General: No scleral icterus.    Conjunctiva/sclera: Conjunctivae normal.  Cardiovascular:     Rate and Rhythm: Normal rate and regular rhythm.     Pulses: Normal pulses.     Heart sounds: Normal heart sounds.  Pulmonary:     Effort: Pulmonary effort is normal.  Breath sounds: Normal breath sounds.  Abdominal:     Palpations: Abdomen is soft. There is no mass.     Tenderness: There is no abdominal tenderness.  Musculoskeletal:     Right lower leg: No edema.     Left lower leg: No edema.  Skin:    General: Skin is warm and dry.  Neurological:     General: No focal deficit present.     Mental Status: He is alert and oriented to person, place, and time.  Psychiatric:        Mood and Affect: Mood normal.        Behavior: Behavior normal.        Thought Content: Thought content normal.        Judgment: Judgment normal.       No results found for any visits on 03/23/22.  Assessment & Plan     1. Pleurisy This started post-COVID apparently.  Prednisone 20 mg daily and obtain chest x-ray. - DG  Chest 2 View; Future  2. Chronic cough Post COVID cough.  May need inhalers, consider pulmonary referral - DG Chest 2 View; Future  3. Chronic left flank pain Prednisone as above.  And actually hurts to lay on his side so it sounds musculoskeletal. Consider imaging as indicated.  4. History of testicular cancer   5. Primary hypertension Excellent control.  6. Seasonal allergic rhinitis due to other allergic trigger    No follow-ups on file.      I, [name], MD, have reviewed all documentation for this visit. The documentation on 03/29/22 for the exam, diagnosis, procedures, and orders are all accurate and complete.    Ferrah Panagopoulos Cranford Mon, MD  [facility] [phone] (phone) [phone] (fax)  [employer]

## 2022-06-22 ENCOUNTER — Ambulatory Visit (INDEPENDENT_AMBULATORY_CARE_PROVIDER_SITE_OTHER): Payer: Managed Care, Other (non HMO) | Admitting: Family Medicine

## 2022-06-22 ENCOUNTER — Encounter: Payer: Self-pay | Admitting: Family Medicine

## 2022-06-22 VITALS — BP 126/83 | HR 81 | Resp 14 | Ht 72.0 in | Wt 263.0 lb

## 2022-06-22 DIAGNOSIS — Z0000 Encounter for general adult medical examination without abnormal findings: Secondary | ICD-10-CM | POA: Diagnosis not present

## 2022-06-22 DIAGNOSIS — I10 Essential (primary) hypertension: Secondary | ICD-10-CM

## 2022-06-22 DIAGNOSIS — Z1329 Encounter for screening for other suspected endocrine disorder: Secondary | ICD-10-CM

## 2022-06-22 DIAGNOSIS — Z1389 Encounter for screening for other disorder: Secondary | ICD-10-CM | POA: Diagnosis not present

## 2022-06-22 DIAGNOSIS — Z8547 Personal history of malignant neoplasm of testis: Secondary | ICD-10-CM

## 2022-06-22 DIAGNOSIS — Z23 Encounter for immunization: Secondary | ICD-10-CM | POA: Diagnosis not present

## 2022-06-22 DIAGNOSIS — J3089 Other allergic rhinitis: Secondary | ICD-10-CM | POA: Diagnosis not present

## 2022-06-22 DIAGNOSIS — L989 Disorder of the skin and subcutaneous tissue, unspecified: Secondary | ICD-10-CM

## 2022-06-22 DIAGNOSIS — K219 Gastro-esophageal reflux disease without esophagitis: Secondary | ICD-10-CM

## 2022-06-22 DIAGNOSIS — Z125 Encounter for screening for malignant neoplasm of prostate: Secondary | ICD-10-CM

## 2022-06-22 LAB — POCT URINALYSIS DIPSTICK
Bilirubin, UA: NEGATIVE
Blood, UA: NEGATIVE
Glucose, UA: NEGATIVE
Ketones, UA: NEGATIVE
Leukocytes, UA: NEGATIVE
Nitrite, UA: NEGATIVE
Protein, UA: NEGATIVE
Spec Grav, UA: 1.01 (ref 1.010–1.025)
Urobilinogen, UA: 0.2 E.U./dL
pH, UA: 6.5 (ref 5.0–8.0)

## 2022-06-22 MED ORDER — OMEPRAZOLE 20 MG PO CPDR: 20.0000 mg | DELAYED_RELEASE_CAPSULE | ORAL | 3 refills | Status: AC

## 2022-06-22 NOTE — Progress Notes (Signed)
Complete physical exam  I,Peter Ray,acting as a scribe for Peter Durie, MD.,have documented all relevant documentation on the behalf of Peter Durie, MD,as directed by  Peter Durie, MD while in the presence of Peter Durie, MD.   Patient: Peter Ray   DOB: [birth year]   51 y.o. Male  MRN: [id number] Visit Date: 06/22/2022  Today's healthcare provider: [name], MD   Chief Complaint  Patient presents with   Annual Exam   Subjective    Peter Ray is a 51 y.o. male who presents today for a complete physical exam.  He reports consuming a general diet. Gym/ health club routine includes n/a. He generally feels well. He reports sleeping fairly well. He does not have additional problems to discuss today.  He is married and is expecting his first grandchild year.  Last of  3 children just got married 2 weeks ago. HPI  His only complaint is reflux recently.  Past Medical History:  Diagnosis Date   Allergy    Cancer ([place])    testicular ca. 12 yrs. radiation and surgery   Hypertension    Past Surgical History:  Procedure Laterality Date   COLONOSCOPY WITH PROPOFOL N/A 10/22/2021   Procedure: COLONOSCOPY WITH PROPOFOL;  Surgeon: Lucilla Lame, MD;  Location: [place];  Service: Endoscopy;  Laterality: N/A;   POLYPECTOMY N/A 10/22/2021   Procedure: POLYPECTOMY;  Surgeon: Lucilla Lame, MD;  Location: [place];  Service: Endoscopy;  Laterality: N/A;   TESTICLE REMOVAL Right 2001   secondary to cancer   TONSILLECTOMY     Social History   Socioeconomic History   Marital status: Married    Spouse name: Not on file   Number of children: Not on file   Years of education: Not on file   Highest education level: Not on file  Occupational History   Not on file  Tobacco Use   Smoking status: Never   Smokeless tobacco: Never  Vaping Use   Vaping Use: Never used  Substance and Sexual Activity   Alcohol use: Yes     Alcohol/week: 0.0 standard drinks of alcohol    Comment: occasional    Drug use: No   Sexual activity: Not on file  Other Topics Concern   Not on file  Social History Narrative   Not on file   Social Determinants of Health   Financial Resource Strain: Not on file  Food Insecurity: Not on file  Transportation Needs: Not on file  Physical Activity: Not on file  Stress: Not on file  Social Connections: Not on file  Intimate Partner Violence: Not on file   Family Status  Relation Name Status   Mother  Alive   Father  Deceased   Daughter  Alive   Son  Alive   Son  Alive   History reviewed. No pertinent family history. Allergies  Allergen Reactions   Iodinated Contrast Media     Contrast, 12 yrs ago hives, itching, difficulty breathing.    Levofloxacin Itching    Redness   Sertraline Hives   Shellfish Allergy    Tree Extract     and grass   Bupropion Rash    Also lips were a little swollen    Patient Care Team: Jerrol Banana., MD as PCP - General (Family Medicine)   Medications: Outpatient Medications Prior to Visit  Medication Sig   fexofenadine (ALLEGRA) 60 MG tablet Take 60 mg by mouth  2 (two) times daily.   fluticasone (FLONASE) 50 MCG/ACT nasal spray Place into both nostrils daily as needed.    Multiple Vitamins-Minerals (EMERGEN-C IMMUNE PO) Take by mouth daily as needed.   predniSONE (DELTASONE) 20 MG tablet Take 1 tablet (20 mg total) by mouth daily with breakfast.   telmisartan (MICARDIS) 80 MG tablet TAKE 1/2 TABLET(40 MG) BY MOUTH DAILY   No facility-administered medications prior to visit.    Review of Systems  Allergic/Immunologic: Positive for food allergies.  Neurological:  Positive for light-headedness.  All other systems reviewed and are negative.      Objective     BP 126/83 (BP Location: Left Arm, Patient Position: Sitting, Cuff Size: Large)   Pulse 81   Resp 14   Ht 6' (1.829 m)   Wt 263 lb (119.3 kg)   SpO2 99%   BMI 35.67  kg/m      Physical Exam Vitals reviewed.  Constitutional:      Appearance: Normal appearance.  HENT:     Head: Normocephalic and atraumatic.     Right Ear: External ear normal.     Left Ear: External ear normal.     Mouth/Throat:     Pharynx: Oropharynx is clear.  Eyes:     General: No scleral icterus.    Conjunctiva/sclera: Conjunctivae normal.  Cardiovascular:     Rate and Rhythm: Normal rate and regular rhythm.     Pulses: Normal pulses.     Heart sounds: Normal heart sounds.  Pulmonary:     Effort: Pulmonary effort is normal.     Breath sounds: Normal breath sounds.  Abdominal:     Palpations: Abdomen is soft.  Musculoskeletal:     Cervical back: Neck supple.     Right lower leg: No edema.     Left lower leg: No edema.  Skin:    General: Skin is warm and dry.  Neurological:     General: No focal deficit present.     Mental Status: He is alert and oriented to person, place, and time.  Psychiatric:        Mood and Affect: Mood normal.        Behavior: Behavior normal.        Thought Content: Thought content normal.        Judgment: Judgment normal.       Last depression screening scores    06/22/2022    9:10 AM 12/08/2021    8:40 AM 06/10/2021    8:29 AM  PHQ 2/9 Scores  PHQ - 2 Score 0 0 0  PHQ- 9 Score 0 0 1   Last fall risk screening    06/22/2022    9:10 AM  [place] in the past year? 0  Number falls in past yr: 0  Injury with Fall? 0  Risk for fall due to : No Fall Risks  Follow up Falls evaluation completed   Last Audit-C alcohol use screening    06/22/2022    9:10 AM  Alcohol Use Disorder Test (AUDIT)  1. How often do you have a drink containing alcohol? 3  2. How many drinks containing alcohol do you have on a typical day when you are drinking? 0  3. How often do you have six or more drinks on one occasion? 0  AUDIT-C Score 3  4. How often during the last year have you found that you were not able to stop drinking once you had  started?  0  5. How often during the last year have you failed to do what was normally expected from you because of drinking? 0  6. How often during the last year have you needed a first drink in the morning to get yourself going after a heavy drinking session? 0  7. How often during the last year have you had a feeling of guilt of remorse after drinking? 0  8. How often during the last year have you been unable to remember what happened the night before because you had been drinking? 0  9. Have you or someone else been injured as a result of your drinking? 0  10. Has a relative or friend or a doctor or another health worker been concerned about your drinking or suggested you cut down? 0  Alcohol Use Disorder Identification Test Final Score (AUDIT) 3   A score of 3 or more in women, and 4 or more in men indicates increased risk for alcohol abuse, EXCEPT if all of the points are from question 1   No results found for any visits on 06/22/22.  Assessment & Plan    Routine Health Maintenance and Physical Exam  Exercise Activities and Dietary recommendations  Goals   None     Immunization History  Administered Date(s) Administered   DTaP 08/07/1974   Diphtheria 06/02/1981   Hepatitis B 06/07/2011, 07/08/2011   IPV 08/07/1974, 06/02/1981   MMR 11/22/1972, 07/08/2011   Moderna Sars-Covid-2 Vaccination 05/04/2020, 06/01/2020   Td 03/31/1978, 06/02/1981, 08/18/1990   Tdap 06/07/2011, 06/22/2022   Varicella 06/10/2011, 07/08/2011    Health Maintenance  Topic Date Due   COVID-19 Vaccine (3 - Moderna series) 07/27/2020   Zoster Vaccines- Shingrix (1 of 2) Never done   INFLUENZA VACCINE  Never done   COLONOSCOPY (Pts 45-54yrs Insurance coverage will need to be confirmed)  10/22/2028   TETANUS/TDAP  06/22/2032   Hepatitis C Screening  Completed   HIV Screening  Completed   HPV VACCINES  Aged Out    Discussed health benefits of physical activity, and encouraged him to engage in regular  exercise appropriate for his age and condition.  1. Annual physical exam Updated. - Lipid panel - TSH - CBC w/Diff/Platelet - Comprehensive Metabolic Panel (CMET)  2. Essential hypertension  - Lipid panel - TSH - CBC w/Diff/Platelet - Comprehensive Metabolic Panel (CMET)  3. Screening for thyroid disorder  - Lipid panel - TSH - CBC w/Diff/Platelet - Comprehensive Metabolic Panel (CMET)  4. Seasonal allergic rhinitis due to other allergic trigger  - Lipid panel - TSH - CBC w/Diff/Platelet - Comprehensive Metabolic Panel (CMET)  5. Prostate cancer screening  - PSA  6. Screening for hematuria or proteinuria  - POCT urinalysis dipstick--Negative  7. Need for Tdap vaccination  - Tdap vaccine greater than or equal to 7yo IM  8. Skin lesion of face  - Ambulatory referral to Dermatology  9. Gastroesophageal reflux disease, unspecified whether esophagitis present On omeprazole.  Follow-up 3 to 4 months and told him he can cut back in the meantime if is working well. - omeprazole (PRILOSEC) 20 MG capsule; Take 1 capsule (20 mg total) by mouth every morning.  Dispense: 90 capsule; Refill: 3 10.  History of testicular cancer  No follow-ups on file.     I, Peter Durie, MD, have reviewed all documentation for this visit. The documentation on 06/25/22 for the exam, diagnosis, procedures, and orders are all accurate and complete.    Jozette Castrellon Cranford Mon,  MD  [facility] [phone] (phone) [phone] (fax)  [place]

## 2022-06-23 LAB — COMPREHENSIVE METABOLIC PANEL
ALT: 24 IU/L (ref 0–44)
AST: 22 IU/L (ref 0–40)
Albumin/Globulin Ratio: 2.1 (ref 1.2–2.2)
Albumin: 5 g/dL (ref 4.1–5.1)
Alkaline Phosphatase: 69 IU/L (ref 44–121)
BUN/Creatinine Ratio: 19 (ref 9–20)
BUN: 20 mg/dL (ref 6–24)
Bilirubin Total: 0.5 mg/dL (ref 0.0–1.2)
CO2: 20 mmol/L (ref 20–29)
Calcium: 9.7 mg/dL (ref 8.7–10.2)
Chloride: 101 mmol/L (ref 96–106)
Creatinine, Ser: 1.04 mg/dL (ref 0.76–1.27)
Globulin, Total: 2.4 g/dL (ref 1.5–4.5)
Glucose: 96 mg/dL (ref 70–99)
Potassium: 4.6 mmol/L (ref 3.5–5.2)
Sodium: 141 mmol/L (ref 134–144)
Total Protein: 7.4 g/dL (ref 6.0–8.5)
eGFR: 87 mL/min/{1.73_m2} (ref >59)

## 2022-06-23 LAB — CBC WITH DIFFERENTIAL/PLATELET
Basophils Absolute: 0.1 10*3/uL (ref 0.0–0.2)
Basos: 1 %
EOS (ABSOLUTE): 0.3 10*3/uL (ref 0.0–0.4)
Eos: 4 %
Hematocrit: 44.8 % (ref 37.5–51.0)
Hemoglobin: 15.4 g/dL (ref 13.0–17.7)
Immature Grans (Abs): 0 10*3/uL (ref 0.0–0.1)
Immature Granulocytes: 0 %
Lymphocytes Absolute: 2.1 10*3/uL (ref 0.7–3.1)
Lymphs: 26 %
MCH: 30.6 pg (ref 26.6–33.0)
MCHC: 34.4 g/dL (ref 31.5–35.7)
MCV: 89 fL (ref 79–97)
Monocytes Absolute: 0.5 10*3/uL (ref 0.1–0.9)
Monocytes: 7 %
Neutrophils Absolute: 4.8 10*3/uL (ref 1.4–7.0)
Neutrophils: 62 %
Platelets: 258 10*3/uL (ref 150–450)
RBC: 5.03 x10E6/uL (ref 4.14–5.80)
RDW: 11.8 % (ref 11.6–15.4)
WBC: 7.8 10*3/uL (ref 3.4–10.8)

## 2022-06-23 LAB — LIPID PANEL
Chol/HDL Ratio: 4.6 ratio (ref 0.0–5.0)
Cholesterol, Total: 235 mg/dL — ABNORMAL HIGH (ref 100–199)
HDL: 51 mg/dL (ref >39)
LDL Chol Calc (NIH): 155 mg/dL — ABNORMAL HIGH (ref 0–99)
Triglycerides: 163 mg/dL — ABNORMAL HIGH (ref 0–149)
VLDL Cholesterol Cal: 29 mg/dL (ref 5–40)

## 2022-06-23 LAB — PSA: Prostate Specific Ag, Serum: 0.4 ng/mL (ref 0.0–4.0)

## 2022-06-23 LAB — TSH: TSH: 1.44 u[IU]/mL (ref 0.450–4.500)

## 2022-07-01 ENCOUNTER — Ambulatory Visit: Payer: Self-pay | Admitting: *Deleted

## 2022-07-01 NOTE — Telephone Encounter (Signed)
Summary: medication reaction   Pt states after taking Prilosec in the morning he experience respiratory issues   Pt inquiring if he should d/c medication   Please advise      Reason for Disposition . [1] Caller has URGENT medicine question about med that PCP or specialist prescribed AND [2] triager unable to answer question  Answer Assessment - Initial Assessment Questions 1. NAME of MEDICINE: "What medicine(s) are you calling about?"     Prilosec- started Sunday-within morning-  Also has exposure to poison oak- itching Monday- arm- blister stage 2. QUESTION: "What is your question?" (e.g., double dose of medicine, side effect)     Patient wants to know if he can stop Prilosec-- going out of town and wants to stop while gone- comes back Tuesday. Last dose was this morning. Patient states it did help reflux 3. PRESCRIBER: "Who prescribed the medicine?" Reason: if prescribed by specialist, call should be referred to that group.     PCP 4. SYMPTOMS: "Do you have any symptoms?" If Yes, ask: "What symptoms are you having?"  "How bad are the symptoms (e.g., mild, moderate, severe)     Respiratory symptoms- pressure- mild- no SOB  Patient advised hold medication until he hears from PCP- last dose this am  Protocols used: Medication Question Call-A-AH

## 2022-07-01 NOTE — Telephone Encounter (Signed)
Patient advised. Verbalized understanding 

## 2022-07-05 ENCOUNTER — Telehealth: Payer: Managed Care, Other (non HMO) | Admitting: Physician Assistant

## 2022-07-05 DIAGNOSIS — L255 Unspecified contact dermatitis due to plants, except food: Secondary | ICD-10-CM | POA: Diagnosis not present

## 2022-07-05 MED ORDER — PREDNISONE 10 MG PO TABS: ORAL_TABLET | ORAL | 0 refills | Status: AC

## 2022-07-05 NOTE — Progress Notes (Signed)
I have spent 5 minutes in review of e-visit questionnaire, review and updating patient chart, medical decision making and response to patient.   Melonee Gerstel Cody Aryanne Gilleland, PA-C    

## 2022-07-05 NOTE — Progress Notes (Signed)

## 2023-02-06 ENCOUNTER — Telehealth: Payer: Self-pay | Admitting: Family Medicine

## 2023-02-06 NOTE — Telephone Encounter (Signed)
Patient need appointment with new provider or we need to ask if he is going to follow Dr. Sullivan Lone?

## 2023-02-06 NOTE — Telephone Encounter (Signed)
[facility] pharmacy faxed refill request for the following medications:    telmisartan (MICARDIS) 80 MG tablet    Please advise

## 2024-01-24 IMAGING — CR DG CHEST 2V
1 series · 2 of 2 positions shown · non-contrast
Comparison: Chest radiograph 08/13/2015

CLINICAL DATA: Right-sided chest pain with deep inspiration.

EXAM:
CHEST - 2 VIEW

[Series 1: dg chest 2 view · 0.14mm/px · 2 of 2 slices shown]
[im 1/2]
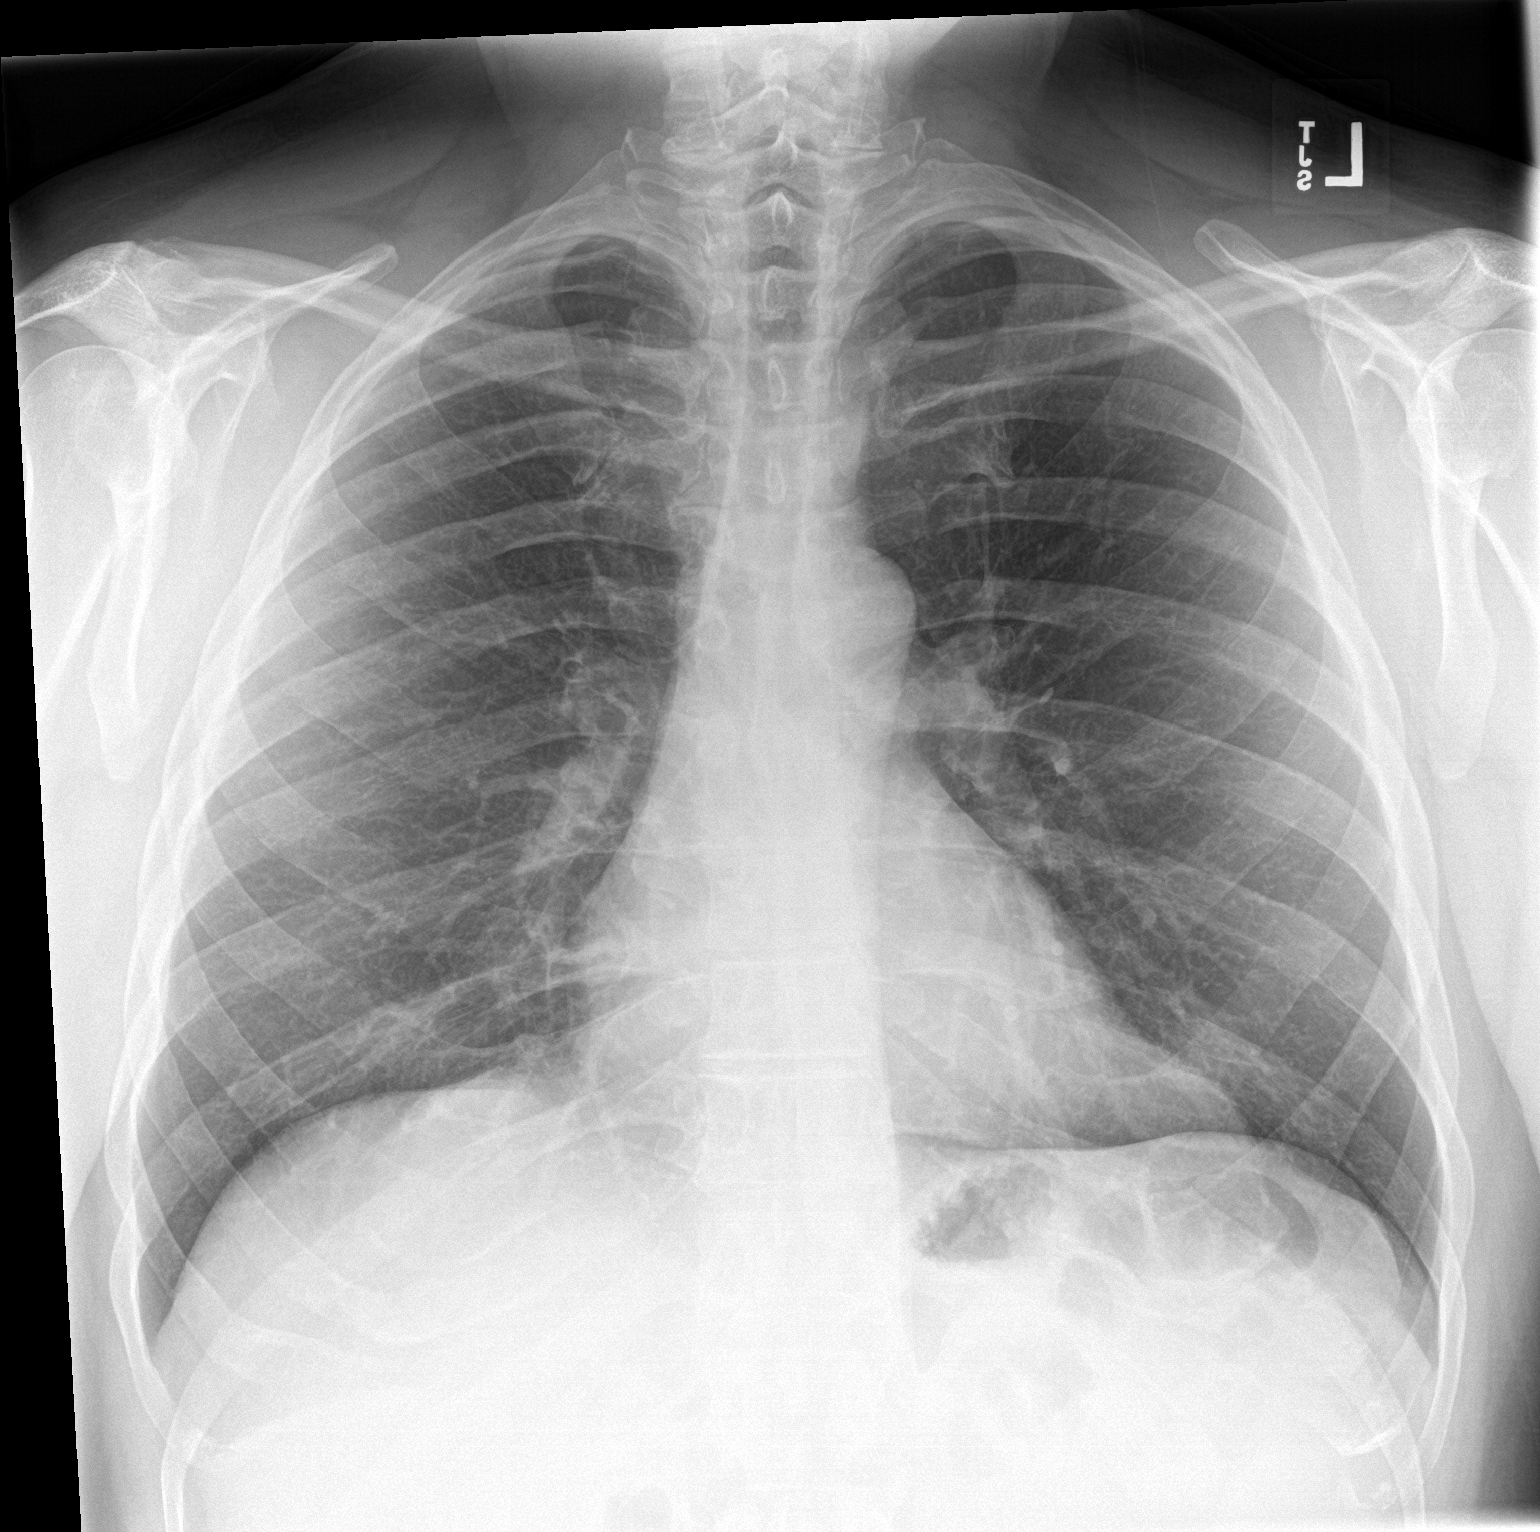
[im 2/2]
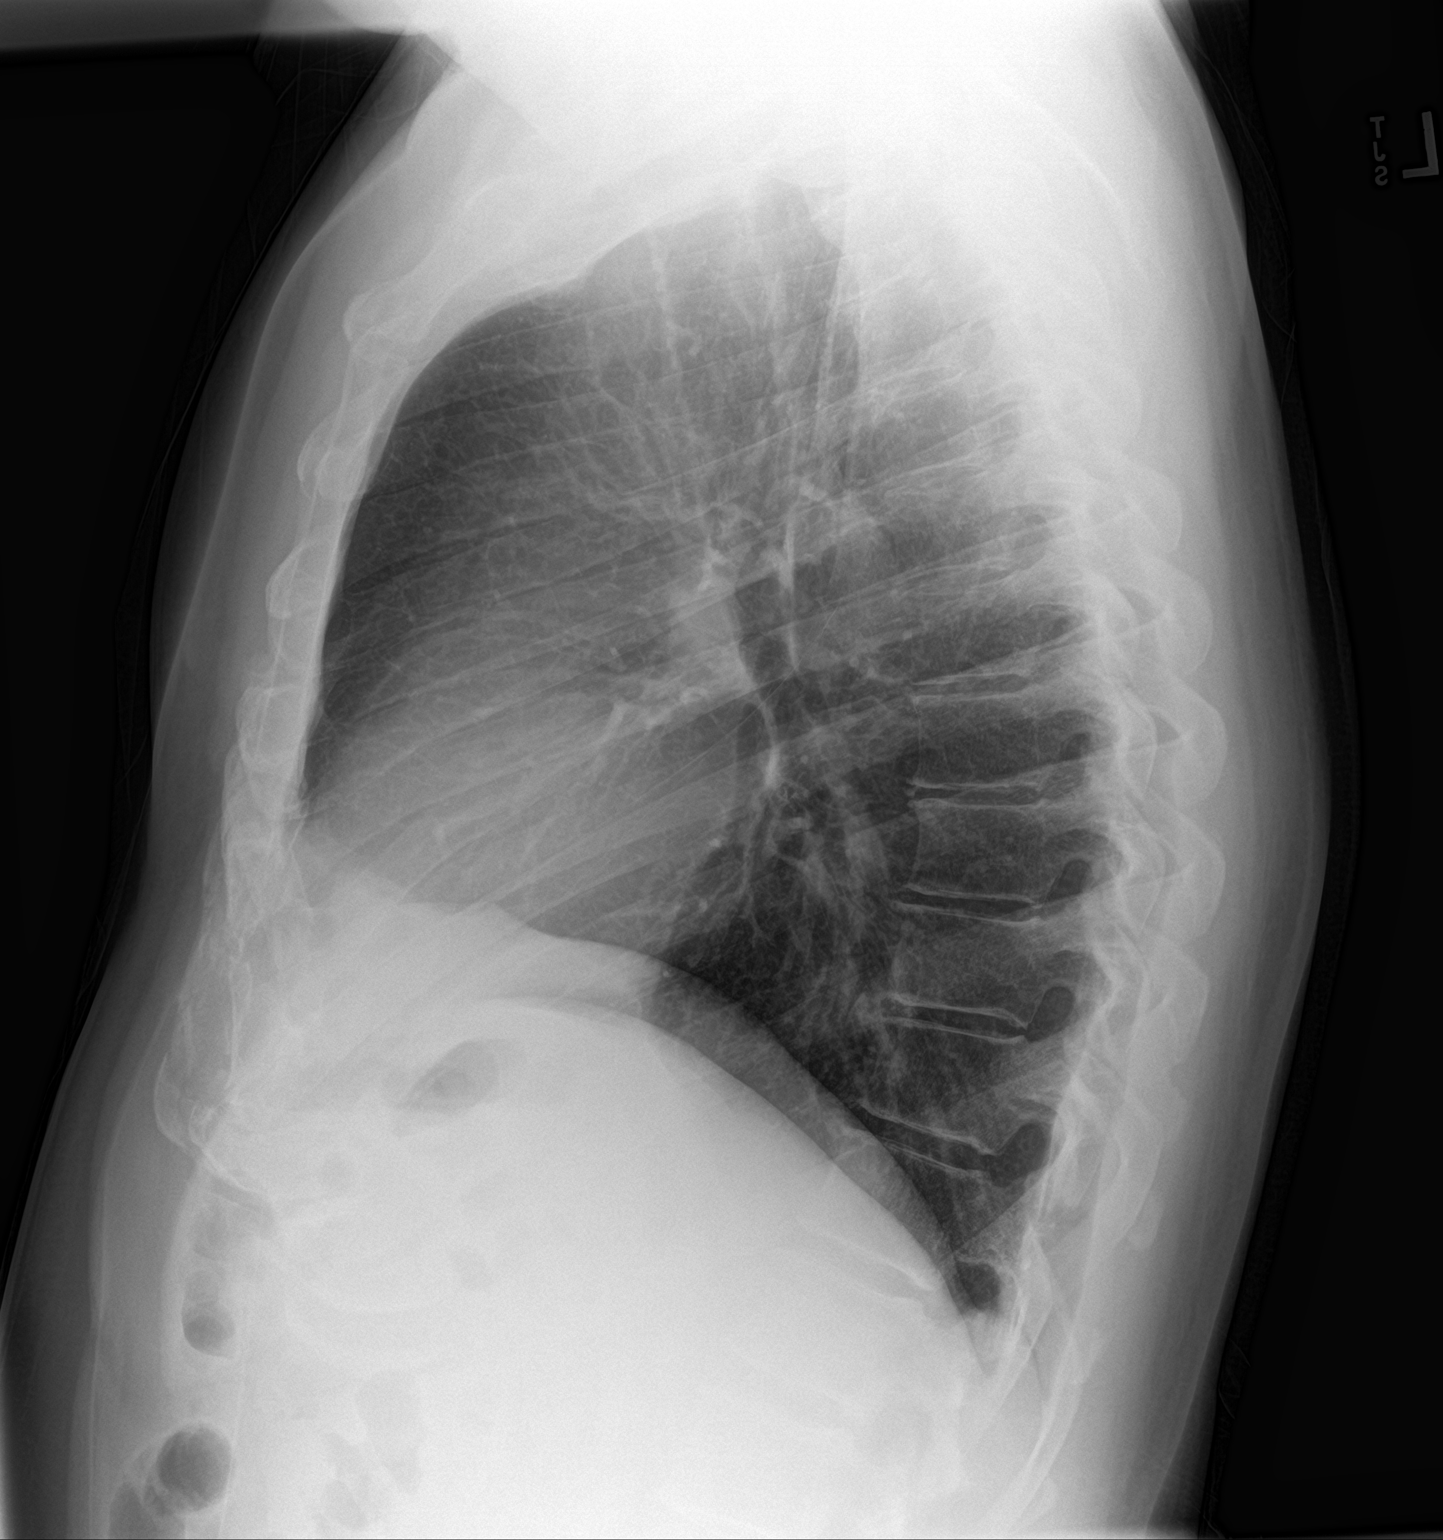

[2 of 2 positions shown; findings below may reference images not displayed]

FINDINGS: The heart size and mediastinal contours are within normal limits.
Both lungs are clear. The visualized skeletal structures are
unremarkable.
IMPRESSION: No active cardiopulmonary disease.
